# Patient Record
Sex: Female | Born: 1955 | Race: White | Hispanic: No | Marital: Married | Smoking: Former smoker
Health system: Southern US, Community
[De-identification: ages and names within clinical notes are randomized; demographics above are authoritative.]

## PROBLEM LIST (undated history)

## (undated) DIAGNOSIS — E785 Hyperlipidemia, unspecified: Secondary | ICD-10-CM

## (undated) DIAGNOSIS — G4700 Insomnia, unspecified: Secondary | ICD-10-CM

---

## 2007-01-29 ENCOUNTER — Ambulatory Visit: Payer: Self-pay | Admitting: Family Medicine

## 2007-06-27 HISTORY — PX: SEPTOPLASTY: SUR1290

## 2009-05-25 ENCOUNTER — Ambulatory Visit: Payer: Self-pay | Admitting: Family Medicine

## 2009-05-25 DIAGNOSIS — G4700 Insomnia, unspecified: Secondary | ICD-10-CM | POA: Insufficient documentation

## 2009-05-26 ENCOUNTER — Encounter: Payer: Self-pay | Admitting: Family Medicine

## 2009-05-27 DIAGNOSIS — E785 Hyperlipidemia, unspecified: Secondary | ICD-10-CM | POA: Insufficient documentation

## 2009-05-27 LAB — CONVERTED CEMR LAB
ALT: 10 units/L (ref 0–35)
AST: 14 units/L (ref 0–37)
Albumin: 4.5 g/dL (ref 3.5–5.2)
Alkaline Phosphatase: 66 units/L (ref 39–117)
BUN: 15 mg/dL (ref 6–23)
CO2: 29 meq/L (ref 19–32)
Calcium: 9.1 mg/dL (ref 8.4–10.5)
Chloride: 103 meq/L (ref 96–112)
Cholesterol: 237 mg/dL — ABNORMAL HIGH (ref 0–200)
Creatinine, Ser: 0.71 mg/dL (ref 0.40–1.20)
Glucose, Bld: 91 mg/dL (ref 70–99)
HDL: 69 mg/dL (ref >39)
LDL Cholesterol: 133 mg/dL — ABNORMAL HIGH (ref 0–99)
Potassium: 4.5 meq/L (ref 3.5–5.3)
Sodium: 139 meq/L (ref 135–145)
Total Bilirubin: 0.5 mg/dL (ref 0.3–1.2)
Total CHOL/HDL Ratio: 3.4
Total Protein: 7.2 g/dL (ref 6.0–8.3)
Triglycerides: 173 mg/dL — ABNORMAL HIGH (ref <150)
VLDL: 35 mg/dL (ref 0–40)

## 2009-05-30 ENCOUNTER — Telehealth: Payer: Self-pay | Admitting: Family Medicine

## 2009-05-30 ENCOUNTER — Encounter: Admission: RE | Admit: 2009-05-30 | Discharge: 2009-05-30 | Payer: Self-pay | Admitting: Family Medicine

## 2009-05-31 ENCOUNTER — Other Ambulatory Visit: Admission: RE | Admit: 2009-05-31 | Discharge: 2009-05-31 | Payer: Self-pay | Admitting: Family Medicine

## 2009-05-31 ENCOUNTER — Ambulatory Visit: Payer: Self-pay | Admitting: Family Medicine

## 2009-06-03 LAB — CONVERTED CEMR LAB
Pap Smear: NEGATIVE
Pap Smear: NORMAL

## 2010-02-14 ENCOUNTER — Ambulatory Visit: Payer: Self-pay | Admitting: Family Medicine

## 2010-02-14 DIAGNOSIS — B009 Herpesviral infection, unspecified: Secondary | ICD-10-CM | POA: Insufficient documentation

## 2010-02-14 LAB — CONVERTED CEMR LAB: Rapid Strep: NEGATIVE

## 2010-03-28 NOTE — Progress Notes (Signed)
Summary: Order for DEXA

## 2010-03-28 NOTE — Assessment & Plan Note (Signed)
Summary: NOV: Preventative Care   Vital Signs:  Patient profile:   55 year old female Height:      61.1 inches Weight:      118 pounds BMI:     22.30 Pulse rate:   63 / minute BP sitting:   123 / 71  (left arm) Cuff size:   regular  Vitals Entered By: Kathlene November (May 25, 2009 9:56 AM) CC: NP- get established Is Patient Diabetic? No   CC:  NP- get established.  Habits & Providers  Alcohol-Tobacco-Diet     Alcohol drinks/day: <1     Alcohol type: wine     Tobacco Status: quit     Year Quit: 2008  Exercise-Depression-Behavior     Does Patient Exercise: yes     Type of exercise: tennis, swimming     Exercise (avg: min/session): >60     Times/week: 3     Have you felt down or hopeless? no     STD Risk: never     Drug Use: never     Seat Belt Use: always  Current Medications (verified): 1)  None  Allergies (verified): No Known Drug Allergies  Comments:  Nurse/Medical Assistant: The patient's medications and allergies were reviewed with the patient and were updated in the Medication and Allergy Lists. Kathlene November (May 25, 2009 9:58 AM)  Past History:  Past Medical History: None  Past Surgical History: Appendectomy 03/24/09 Septoplasty 06/2007  Family History: Father deceased, skin Ca, HTN Mother with HTN.   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  Married to [place]. No kids.  Former Smoker Alcohol use-yes Regular exercise-yes 2 caffeinated drinks per day. Smoking Status:  quit Does Patient Exercise:  yes STD Risk:  never Drug Use:  never Seat Belt Use:  always  Review of Systems       No fever/sweats/weakness, unexplained weight loss/gain.  No vison changes.  No difficulty hearing/ringing in ears, hay fever/allergies.  No chest pain/discomfort, palpitations.  No Br lump/nipple discharge.  No cough/wheeze.  No blood in BM, nausea/vomiting/diarrhea.  No nighttime urination, leaking urine, unusual vaginal bleeding,  discharge (penis or vagina).  No muscle/joint pain. No rash, change in mole.  No HA, memory loss.  No anxiety, + sleep d/o, no depression.  No easy bruising/bleeding, unexplained lump   Physical Exam  General:  Well-developed,well-nourished,in no acute distress; alert,appropriate and cooperative throughout examination Head:  Normocephalic and atraumatic without obvious abnormalities. No apparent alopecia or balding. Lungs:  Normal respiratory effort, chest expands symmetrically. Lungs are clear to auscultation, no crackles or wheezes. Heart:  Normal rate and regular rhythm. S1 and S2 normal without gallop, murmur, click, rub or other extra sounds. Skin:  no rashes.   Psych:  Cognition and judgment appear intact. Alert and cooperative with normal attention span and concentration. No apparent delusions, illusions, hallucinations   Impression & Recommendations:  Problem # 1:  COUNSELING NOS (ICD-V65.40) Reviewed preventative care for her age group.  Given info to schedule her mammogram. Try to go for labs and then needs to schedule a CPE wiht pap as well.  Discussed colon Ca screening. She wants to think about this. Given Tdap today. Spent 25 min face to face in counseling.   Other Orders: T-Comprehensive Metabolic Panel [id number]) T-Lipid Profile [id number]) T-Mammography Bilateral Screening (41324) Tdap => 74yrs IM ([address]02) Admin 1st Vaccine (72536)  Patient Instructions: 1)  [facility]. Consider these for colon Cancer screening and give  me a call when you are ready.  2)  Please schedule a physical with pap smear. 3)  The lab is on the first floor, open Mon-Fri 8AM-5PM.  4)  Call to schedule your mammogram.   TD Result Date:  02/27/1999 TD Result:  given TD Next Due:  10 yr   Immunizations Administered:  Tetanus Vaccine:    Vaccine Type: Tdap    Site: left deltoid    Mfr: GlaxoSmithKline    Given by: Kathlene November    Exp. Date: 05/21/2011     Lot #: [id number]    VIS given: 01/14/07 version given May 25, 2009.

## 2010-03-28 NOTE — Assessment & Plan Note (Signed)
Summary: CPE & PAP   Vital Signs:  Patient profile:   55 year old female Menstrual status:  irregular LMP:     2-3 Height:      61.1 inches Weight:      120 pounds Pulse rate:   77 / minute BP sitting:   119 / 65  (left arm) Cuff size:   regular  Vitals Entered By: Kathlene November (May 31, 2009 10:03 AM) CC: CPE with pap LMP (date): 2-3     Menstrual Status irregular Enter LMP: 2-3   Primary Care Provider:  Nani Gasser   CC:  CPE with pap.  History of Present Illness: Insomoni: Usually has problems staying asleep.  Before would wake up with hot flashes but that has gotten better.  happens a couple fo nights a week. Now feels like she is in  a cycle. - Has tried Tylenol PM but makes her knees "feel weird".  Has tried [place] in teh past and that has helpd. Also has tried melanonin. Somonex made her feel groggy.   Problems Prior to Update: 1)  Hyperlipidemia  (ICD-272.4) 2)  Counseling Nos  (ICD-V65.40) 3)  Insomnia  (ICD-780.52) 4)  Health Maintenance Exam  (ICD-V70.0)  Current Medications (verified): 1)  Fish Oil 1000 Mg Caps (Omega-3 Fatty Acids) .... 2 Tabs By Mouth Daily  Allergies (verified): No Known Drug Allergies  Comments:  Nurse/Medical Assistant: The patient's medications and allergies were reviewed with the patient and were updated in the Medication and Allergy Lists. 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  Married to [place]. No kids.  Former Smoker Alcohol use-yes Regular exercise-yes 2 caffeinated drinks per day.   Review of Systems  The patient denies anorexia, fever, weight loss, weight gain, vision loss, decreased hearing, hoarseness, chest pain, syncope, dyspnea on exertion,  peripheral edema, prolonged cough, headaches, hemoptysis, abdominal pain, melena, hematochezia, severe indigestion/heartburn, hematuria, incontinence, genital sores, muscle weakness, suspicious skin lesions, transient blindness, difficulty walking, depression, unusual weight change, abnormal bleeding, enlarged lymph nodes, and breast masses.    Physical Exam  General:  Well-developed,well-nourished,in no acute distress; alert,appropriate and cooperative throughout examination Head:  Normocephalic and atraumatic without obvious abnormalities. No apparent alopecia or balding. Eyes:  No corneal or conjunctival inflammation noted. EOMI. Perrla.  Ears:  External ear exam shows no significant lesions or deformities.  Otoscopic examination reveals clear canals, tympanic membranes are intact bilaterally without bulging, retraction, inflammation or discharge. Hearing is grossly normal bilaterally. Nose:  External nasal examination shows no deformity or inflammation.  Mouth:  Oral mucosa and oropharynx without lesions or exudates.  Teeth in good repair. Neck:  No deformities, masses, or tenderness noted. Chest Wall:  No deformities, masses, or tenderness noted. Breasts:  No mass, nodules, thickening, tenderness, bulging, retraction, inflamation, nipple discharge or skin changes noted.   Lungs:  Normal respiratory effort, chest expands symmetrically. Lungs are clear to auscultation, no crackles or wheezes. Heart:  Normal rate and regular rhythm. S1 and S2 normal without gallop, murmur, click, rub or other extra sounds. Abdomen:  Bowel sounds positive,abdomen soft and non-tender without masses, organomegaly or hernias noted. Rectal:  No external abnormalities noted. Normal sphincter tone. No rectal masses or tenderness. Genitalia:  normal introitus and no external lesions.  Cervix is stenotic.  Msk:  No deformity or scoliosis noted of thoracic or lumbar spine.   Pulses:  R and L carotid,radial,dorsalis pedis  and posterior tibial pulses are full and equal bilaterally Extremities:  No clubbing, cyanosis, edema, or deformity noted with normal full range of motion of all joints.   Neurologic:  No cranial nerve deficits noted. Station and gait are normal.DTRs are symmetrical throughout. Sensory, motor and coordinative functions appear intact. Skin:  no rashes.   Cervical Nodes:  No lymphadenopathy noted Axillary Nodes:  No palpable lymphadenopathy Psych:  Cognition and judgment appear intact. Alert and cooperative with normal attention span and concentration. No apparent delusions, illusions, hallucinations   Impression & Recommendations:  Problem # 1:  HEALTH MAINTENANCE EXAM (ICD-V70.0) Exam is normal today.  She had her DEXA and mammogram yesterday.  Reviewed her labs wtih her today.  Discussed colon cancer screening.  She will schedule a colonoscopy this summer. She will call us.   Problem # 2:  INSOMNIA (ICD-780.52)  Discussed sleep hygiene. Also discussed different types of sleep aids and potential SE. Will start with generic ambien for as needed use only. Given 30 tab which shouldreally last 2 months. If finding using more frequently then recommend come in to further evluation. Discussed trying to reset her "sleep cycle".    Her updated medication list for this problem includes:    Zolpidem Tartrate 10 Mg Tabs (Zolpidem tartrate) .Marland Kitchen... Take 1 tablet by mouth once a day at bedtime as needed .  Complete Medication List: 1)  Fish Oil 1000 Mg Caps (Omega-3 fatty acids) .... 2 tabs by mouth daily 2)  Zolpidem Tartrate 10 Mg Tabs (Zolpidem tartrate) .... Take 1 tablet by mouth once a day at bedtime as needed . Prescriptions: ZOLPIDEM TARTRATE 10 MG TABS (ZOLPIDEM TARTRATE) Take 1 tablet by mouth once a day at bedtime as needed .  #30 x 0   Entered and Authorized by:   Nani Gasser MD   Signed by:   Nani Gasser MD on 05/31/2009   Method used:   Print then Give to Patient   RxID:    [id number]   Last TD:  Tdap (05/25/2009 9:46:50 AM) TD Next Due:  10 yr

## 2010-03-30 NOTE — Assessment & Plan Note (Signed)
Summary: SORE THROAT/EAR ACHE   Vital Signs:  Patient profile:   55 year old female Menstrual status:  irregular Height:      61.1 inches Weight:      108 pounds Temp:     98.2 degrees F oral Pulse rate:   81 / minute BP sitting:   136 / 84  (right arm) Cuff size:   regular  Vitals Entered By: Avon Gully CMA, Duncan Dull) (February 14, 2010 1:32 PM) CC: sore throat since yesterday,ear pain   Primary Care Provider:  Nani Gasser   CC:  sore throat since yesterday and ear pain.  History of Present Illness: sore throat started 2 days ago,now having ear pain.  Has a larger fever blister as well.  Has been a little stressed.  No strep exposure.  No fever. No nausea. No HA.  Pain has been moderate. Uncomfrotable to swallow.   Current Medications (verified): 1)  Fish Oil 1000 Mg Caps (Omega-3 Fatty Acids) .... 2 Tabs By Mouth Daily 2)  Zolpidem Tartrate 10 Mg Tabs (Zolpidem Tartrate) .... Take 1 Tablet By Mouth Once A Day At Bedtime As Needed . 3)  Calcium-Vitamin D 600-200 Mg-Unit Tabs (Calcium-Vitamin D) .... Take 1 Tablet By Mouth Two Times A Day 4)  Alendronate Sodium 70 Mg Tabs (Alendronate Sodium) .... One By Mouth Once A Week.  Allergies (verified): No Known Drug Allergies  Comments:  Nurse/Medical Assistant: The patient's medications and allergies were reviewed with the patient and were updated in the Medication and Allergy Lists. Avon Gully CMA, Duncan Dull) (February 14, 2010 1:33 PM)  Physical Exam  General:  Well-developed,well-nourished,in no acute distress; alert,appropriate and cooperative throughout examination Head:  Normocephalic and atraumatic without obvious abnormalities. No apparent alopecia or balding. Eyes:  No corneal or conjunctival inflammation noted. EOMI. Perrla.  Ears:  External ear exam shows no significant lesions or deformities.  Otoscopic examination reveals clear canals, tympanic membranes are intact bilaterally without bulging,  retraction, inflammation or discharge. Hearing is grossly normal bilaterally. Nose:  External nasal examination shows no deformity or inflammation. Nasal mucosa are pink and moist without lesions or exudates. Mouth:  Oral mucosa and oropharynx without lesions or exudates.  Teeth in good repair. Petechia on the uvula.  Neck:  No deformities, masses, or tenderness noted. Lungs:  Normal respiratory effort, chest expands symmetrically. Lungs are clear to auscultation, no crackles or wheezes. Heart:  Normal rate and regular rhythm. S1 and S2 normal without gallop, murmur, click, rub or other extra sounds. Skin:  no rashes.   Cervical Nodes:  Mild anter cerv LN.  Psych:  Cognition and judgment appear intact. Alert and cooperative with normal attention span and concentration. No apparent delusions, illusions, hallucinations   Impression & Recommendations:  Problem # 1:  PHARYNGITIS-ACUTE (ICD-462)  Strep is neg. LIkely viral. Exam is failry benign except for LN.  Call if not better in one week. No ABX needed. Reiterated this to the patient.   Orders: Rapid Strep (16109)  Problem # 2:  COLD SORE (ICD-054.9) Discussed options. Will tx with acyclovir. Discussed have to terat within 24 hours of onset.   Complete Medication List: 1)  Fish Oil 1000 Mg Caps (Omega-3 fatty acids) .... 2 tabs by mouth daily 2)  Zolpidem Tartrate 10 Mg Tabs (Zolpidem tartrate) .... Take 1 tablet by mouth once a day at bedtime as needed . 3)  Calcium-vitamin D 600-200 Mg-unit Tabs (Calcium-vitamin d) .... Take 1 tablet by mouth two times a day 4)  Alendronate Sodium 70 Mg Tabs (Alendronate sodium) .... One by mouth once a week. 5)  Acyclovir 400 Mg Tabs (Acyclovir) .... Take 1 tablet by mouth three times a day for 5 days.  Patient Instructions: 1)  Call if still having Sore Throat in one week.  2)  Call if getting worse Prescriptions: ACYCLOVIR 400 MG TABS (ACYCLOVIR) Take 1 tablet by mouth three times a day for 5  days.  #15 x 0   Entered and Authorized by:   Nani Gasser MD   Signed by:   Nani Gasser MD on 02/14/2010   Method used:   [facility]        [facility] [phone][facility] (retail)       [address] [place], [place]  [address]       Ph: [phone] or [phone]       Fax: [phone]   RxID:   [id number]    Orders Added: 1)  Rapid Strep [32355] 2)  Est. Patient Level III [73220]    Laboratory Results  Date/Time Received: 02/14/10 Date/Time Reported: 02/14/10  Other Tests  Rapid Strep: negative

## 2010-05-24 ENCOUNTER — Encounter: Payer: Self-pay | Admitting: Emergency Medicine

## 2010-05-24 ENCOUNTER — Inpatient Hospital Stay (INDEPENDENT_AMBULATORY_CARE_PROVIDER_SITE_OTHER)
Admission: RE | Admit: 2010-05-24 | Discharge: 2010-05-24 | Disposition: A | Discharge status: Home / Self Care | Payer: BC Managed Care – PPO | Source: Ambulatory Visit | Attending: Emergency Medicine | Admitting: Emergency Medicine

## 2010-05-24 DIAGNOSIS — J0100 Acute maxillary sinusitis, unspecified: Secondary | ICD-10-CM

## 2010-05-27 ENCOUNTER — Other Ambulatory Visit: Payer: Self-pay | Admitting: Family Medicine

## 2010-05-30 NOTE — Assessment & Plan Note (Signed)
Summary: RASH ON FACE/TJ rm 4   Vital Signs:  Patient Profile:   55 Years Old Female CC:      sinus congestion and cough Height:     61.1 inches Weight:      107.50 pounds O2 Sat:      96 % O2 treatment:    Room Air Temp:     99.8 degrees F oral Pulse rate:   102 / minute Resp:     18 per minute BP sitting:   126 / 80  (left arm) Cuff size:   regular  Vitals Entered By: Clemens Catholic LPN (May 24, 2010 5:49 PM)                  Updated Prior Medication List: FISH OIL 1000 MG CAPS (OMEGA-3 FATTY ACIDS) 2 tabs by mouth daily ZOLPIDEM TARTRATE 10 MG TABS (ZOLPIDEM TARTRATE) Take 1 tablet by mouth once a day at bedtime as needed . CALCIUM-VITAMIN D 600-200 MG-UNIT TABS (CALCIUM-VITAMIN D) Take 1 tablet by mouth two times a day  Current Allergies: No known allergies History of Present Illness History from: patient Chief Complaint: sinus congestion and cough History of Present Illness: 55 Years Old Female complains of onset of cold symptoms for 4-5 days.  Brennah has been using OTC cold meds which is helping a little bit. ? sore throat +cough No pleuritic pain No wheezing +nasal congestion +post-nasal drainage +sinus pain/pressure No chest congestion No itchy/red eyes No earache No hemoptysis No SOB +chills/sweats +fever No nausea No vomiting No abdominal pain No diarrhea No skin rashes No fatigue No myalgias No headache   REVIEW OF SYSTEMS Constitutional Symptoms       Complains of fever, chills, and night sweats.     Denies weight loss, weight gain, and fatigue.  Eyes       Denies change in vision, eye pain, eye discharge, glasses, contact lenses, and eye surgery. Ear/Nose/Throat/Mouth       Complains of ear pain and sinus problems.      Denies hearing loss/aids, change in hearing, ear discharge, dizziness, frequent runny nose, frequent nose bleeds, sore throat, hoarseness, and tooth pain or bleeding.  Respiratory       Complains of dry cough and  wheezing.      Denies productive cough, shortness of breath, asthma, bronchitis, and emphysema/COPD.  Cardiovascular       Complains of tires easily with exhertion.      Denies murmurs and chest pain.    Gastrointestinal       Denies stomach pain, nausea/vomiting, diarrhea, constipation, blood in bowel movements, and indigestion. Genitourniary       Denies painful urination, kidney stones, and loss of urinary control. Neurological       Denies paralysis, seizures, and fainting/blackouts. Musculoskeletal       Complains of muscle pain and joint pain.      Denies joint stiffness, decreased range of motion, redness, swelling, muscle weakness, and gout.  Skin       Denies bruising, unusual mles/lumps or sores, and hair/skin or nail changes.  Psych       Denies mood changes, temper/anger issues, anxiety/stress, speech problems, depression, and sleep problems. Other Comments: pt c/o dry cough, LT side sinus congestion, fever x 4 days. she has used cough gtts, saline NS and Claritin with no relief.   Past History:  Past Medical History: Reviewed history from 05/25/2009 and no changes required. None  Past Surgical History: Reviewed history from 05/25/2009  and no changes required. Appendectomy 03/24/09 Septoplasty 06/2007  Family History: Reviewed history from 05/25/2009 and no changes required. Father deceased, skin Ca, HTN Mother with HTN.   Social History: Reviewed history from 05/25/2009 and no changes required. 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  Married to [place]. No kids.  Former Smoker Alcohol use-yes Regular exercise-yes 2 caffeinated drinks per day.  Physical Exam General appearance: well developed, well nourished, no acute distress Head: maxillary sinus tenderness Nasal: clear discharge Oral/Pharynx: clear PND, no erythema, no exudate Chest/Lungs: no rales, wheezes, or rhonchi bilateral, breath sounds equal without effort Heart: regular rate and   rhythm, no murmur MSE: oriented to time, place, and person Assessment New Problems: SINUSITIS, MAXILLARY, ACUTE (ICD-461.0)   Plan New Medications/Changes: ZITHROMAX Z-PAK 250 MG TABS (AZITHROMYCIN) use as directed  #1 x 0, 05/24/2010, Hoyt Koch MD  New Orders: New Patient Level III [phone] Services provided After hours-Weekends-Holidays [99051] Pulse Oximetry (single measurment) [94760] Planning Comments:   1)  Take the prescribed antibiotic as instructed. 2)  Use nasal saline solution (over the counter) at least 3 times a day. 3)  Use over the counter decongestants like Zyrtec-D every 12 hours as needed to help with congestion. 4)  Can take tylenol every 6 hours or motrin every 8 hours for pain or fever. 5)  Follow up with your primary doctor  if no improvement in 5-7 days, sooner if increasing pain, fever, or new symptoms.    The patient and/or caregiver has been counseled thoroughly with regard to medications prescribed including dosage, schedule, interactions, rationale for use, and possible side effects and they verbalize understanding.  Diagnoses and expected course of recovery discussed and will return if not improved as expected or if the condition worsens. Patient and/or caregiver verbalized understanding.  Prescriptions: ZITHROMAX Z-PAK 250 MG TABS (AZITHROMYCIN) use as directed  #1 x 0   Entered and Authorized by:   Hoyt Koch MD   Signed by:   Hoyt Koch MD on 05/24/2010   Method used:   Telephoned to ...       [facility]  [place] [phone]       [address] [place], [place]  [address]       Ph: [phone] or [phone]       Fax: [phone]   RxID:   [id number]   Orders Added: 1)  New Patient Level III [99203] 2)  Services provided After hours-Weekends-Holidays [99051] 3)  Pulse Oximetry (single measurment) [02542]

## 2010-05-31 ENCOUNTER — Other Ambulatory Visit: Payer: Self-pay | Admitting: *Deleted

## 2010-05-31 MED ORDER — ACYCLOVIR 400 MG PO TABS: 400.0000 mg | ORAL_TABLET | Freq: Three times a day (TID) | ORAL | Status: DC

## 2010-05-31 MED ORDER — ZOLPIDEM TARTRATE 10 MG PO TABS: 10.0000 mg | ORAL_TABLET | Freq: Every evening | ORAL | Status: DC | PRN

## 2010-05-31 NOTE — Telephone Encounter (Signed)
Message left advising pt that rx's have been called to pharm.

## 2010-05-31 NOTE — Telephone Encounter (Signed)
Pt requests refill on 2 medications. She states she called her pharmacy on Saturday and they have not heard from [place].  Pt states she has a fever blister developing and would like acyclovir before it is full blown.  She would also like a refill so she can take as soon as she feels the tingle.  Rx order entered.  Please advise.

## 2010-07-13 ENCOUNTER — Other Ambulatory Visit: Payer: Self-pay | Admitting: Family Medicine

## 2010-07-13 DIAGNOSIS — Z1231 Encounter for screening mammogram for malignant neoplasm of breast: Secondary | ICD-10-CM

## 2010-07-18 ENCOUNTER — Ambulatory Visit
Admission: RE | Admit: 2010-07-18 | Discharge: 2010-07-18 | Disposition: A | Discharge status: Home / Self Care | Payer: BC Managed Care – PPO | Source: Ambulatory Visit | Attending: Family Medicine | Admitting: Family Medicine

## 2010-07-18 ENCOUNTER — Telehealth: Payer: Self-pay | Admitting: Family Medicine

## 2010-07-18 DIAGNOSIS — Z1231 Encounter for screening mammogram for malignant neoplasm of breast: Secondary | ICD-10-CM

## 2010-07-18 NOTE — Telephone Encounter (Signed)
Pt called and said she had screening mammo today at [facility], and they felt a lump on her breast, but the person who took message did not determine or notate which breast.  Recommended to do diagnostic mammogram.  I don't see the mammo report yet.  Please advise. Plan:  Routed to Dr. Marlyne Beards, LPN Domingo Dimes

## 2010-07-19 ENCOUNTER — Other Ambulatory Visit: Payer: Self-pay | Admitting: Family Medicine

## 2010-07-19 ENCOUNTER — Telehealth: Payer: Self-pay | Admitting: Family Medicine

## 2010-07-19 DIAGNOSIS — R928 Other abnormal and inconclusive findings on diagnostic imaging of breast: Secondary | ICD-10-CM

## 2010-07-19 DIAGNOSIS — N63 Unspecified lump in unspecified breast: Secondary | ICD-10-CM

## 2010-07-19 NOTE — Telephone Encounter (Signed)
Will you call again to have them fax the report. I need to know which breast they need the diag on  Or if they want if for both breasts.

## 2010-07-19 NOTE — Telephone Encounter (Signed)
Order has been released-

## 2010-07-19 NOTE — Telephone Encounter (Signed)
Dr. Linford Arnold, I called the [facility] on this patient and they cancelled the prev test ordered and the correct test for diagnostic mammo has been put in as order, but the imaging ctr just needs you to do the referral to say diag mammo.  Thanks. Jarvis Newcomer, LPN Dom[facility] Dimes

## 2010-07-20 ENCOUNTER — Telehealth: Payer: Self-pay | Admitting: Family Medicine

## 2010-07-20 NOTE — Telephone Encounter (Addendum)
Pt called and needs scheduled OV for a lump that is felt in her LT breast from the [facility] staff downstairs when she had just gone in for a routine mammo because the imaging center had sent her a letter saying time to schedule screen mammo.  When the tech was asking questions the patient told the tech that she had felt a pea size lump in LT breast, and at that point the imaging staff told the pt she needed to cancel for the day and she would need diag mammo that was not done downstairs, tore up the order, and told her to come upstairs and schedule with Dr. Linford Arnold.  Pt calls in today to check on status and is confused by the process because she had came in to the office earlier in the week and   spoke with someone to figure out what needed to be done. Plan:  Pt obviously needs office visit for Dr. Linford Arnold to check this lump in LT breast which she has never assessed.  Pt prefers to go local for diag mammo, but Dr. Linford Arnold says she'll have to go to [facility] or [facility] but the problem to sending to [facility] is that is takes longer to get the report since they are not in our system.  She will discuss these issues with the pt at the office visit tomorrow.  Pt scheduled appt for Friday 07-21-10 @ 09:45am. Jarvis Newcomer, LPN [facility] Dimes

## 2010-07-21 ENCOUNTER — Encounter: Payer: Self-pay | Admitting: Family Medicine

## 2010-07-21 ENCOUNTER — Ambulatory Visit (INDEPENDENT_AMBULATORY_CARE_PROVIDER_SITE_OTHER): Payer: BC Managed Care – PPO | Admitting: Family Medicine

## 2010-07-21 VITALS — BP 119/73 | HR 69 | Ht 61.1 in | Wt 109.0 lb

## 2010-07-21 DIAGNOSIS — N63 Unspecified lump in unspecified breast: Secondary | ICD-10-CM

## 2010-07-21 MED ORDER — ALENDRONATE SODIUM 70 MG PO TABS: 70.0000 mg | ORAL_TABLET | ORAL | Status: DC

## 2010-07-21 NOTE — Progress Notes (Signed)
  Subjective:    Patient ID: Jordan Maynard, female    DOB: June 19, 1955, 55 y.o.   MRN: [id number]  HPI Left breat in the lower innner quadrant with a small lump for approximately one month.. No pain or tenderness.  No family hx of brca. Last mammo was last year. No redness or recent infection. She's never had any problems with her breast health before.   Review of Systems     Objective:   Physical Exam    right breast exam is completely normal with no change in skin or palpable nodules. The left breast does have a small 1 x 0.5 cm firm nodule in the inner lower quadrant. Along the edge of the breast tissue. It is nontender and there is no surrounding rash or erythema. No axillary lymphadenopathy.    Assessment & Plan:  Breast lesion-will schedule for diagnostic mammogram. Patient prefers [place]. I did give reassurance is most likely a cyst or benign cystic tissue.

## 2010-07-25 NOTE — Telephone Encounter (Signed)
Dr. Linford Arnold took care of this order.

## 2010-07-26 ENCOUNTER — Telehealth: Payer: Self-pay | Admitting: Family Medicine

## 2010-07-26 NOTE — Telephone Encounter (Signed)
Pt called and was inquiring as to when she was going for diagnostic mammo.  Pt was frustrated that she had not heard anything.  Was in last Friday and Dr. Linford Arnold said she was going to schedule. Plan:  Talked with Dr. Linford Arnold.  Referral was done under imaging.  Had Victorino Dike in referrals pull the referral and get sched today.  Appt 08-01-10 @ 10:45am.  Pt to arrive @ 10:30am.  Pt informed.  Jarvis Newcomer, LPN [facility]

## 2010-07-28 ENCOUNTER — Other Ambulatory Visit: Payer: Self-pay | Admitting: Family Medicine

## 2010-07-28 DIAGNOSIS — G479 Sleep disorder, unspecified: Secondary | ICD-10-CM

## 2010-07-28 MED ORDER — ZOLPIDEM TARTRATE 10 MG PO TABS: 10.0000 mg | ORAL_TABLET | Freq: Every evening | ORAL | Status: DC | PRN

## 2010-07-28 NOTE — Telephone Encounter (Signed)
Pt called and is requesting a refill of her generic ambien 10mg .  Said pharm faxed our office on 07/24/10, and 07/26/10 without a response from our office.   Plan:  Not in system electronically.  Will check the faxes at nurses station.  Retrieved 2 faxes that [facility] had sent.  WIll review for refill and send fax back to the pharmacy.  Zolpidem 10 mg PO #30/0 refills were sent to the pharmacy via fax @ 10:55am.  Pt. Informed. Jarvis Newcomer, LPN [facility]

## 2010-08-15 ENCOUNTER — Encounter: Payer: Self-pay | Admitting: Family Medicine

## 2010-08-24 ENCOUNTER — Encounter: Payer: Self-pay | Admitting: Family Medicine

## 2010-09-11 ENCOUNTER — Encounter: Payer: Self-pay | Admitting: Emergency Medicine

## 2010-09-11 ENCOUNTER — Inpatient Hospital Stay (INDEPENDENT_AMBULATORY_CARE_PROVIDER_SITE_OTHER)
Admission: RE | Admit: 2010-09-11 | Discharge: 2010-09-11 | Disposition: A | Discharge status: Home / Self Care | Payer: BC Managed Care – PPO | Source: Ambulatory Visit | Attending: Emergency Medicine | Admitting: Emergency Medicine

## 2010-09-11 DIAGNOSIS — L2089 Other atopic dermatitis: Secondary | ICD-10-CM | POA: Insufficient documentation

## 2010-09-12 ENCOUNTER — Other Ambulatory Visit: Payer: Self-pay | Admitting: Family Medicine

## 2010-09-12 NOTE — Telephone Encounter (Signed)
[facility] called and needs to verify the directions on the alenodrate 70 mg once weekly script. Plan:  Notified [facility] and verified dosing directions. Jarvis Newcomer, LPN [facility]

## 2010-09-15 ENCOUNTER — Encounter: Payer: Self-pay | Admitting: Family Medicine

## 2010-09-15 ENCOUNTER — Inpatient Hospital Stay (INDEPENDENT_AMBULATORY_CARE_PROVIDER_SITE_OTHER)
Admission: RE | Admit: 2010-09-15 | Discharge: 2010-09-15 | Disposition: A | Discharge status: Home / Self Care | Payer: BC Managed Care – PPO | Source: Ambulatory Visit | Attending: Family Medicine | Admitting: Family Medicine

## 2010-09-15 DIAGNOSIS — L740 Miliaria rubra: Secondary | ICD-10-CM

## 2010-09-25 ENCOUNTER — Other Ambulatory Visit: Payer: Self-pay | Admitting: *Deleted

## 2010-09-25 MED ORDER — ZOLPIDEM TARTRATE 10 MG PO TABS: 10.0000 mg | ORAL_TABLET | Freq: Every evening | ORAL | Status: DC | PRN

## 2010-11-20 ENCOUNTER — Other Ambulatory Visit: Payer: Self-pay | Admitting: *Deleted

## 2010-11-20 DIAGNOSIS — G479 Sleep disorder, unspecified: Secondary | ICD-10-CM

## 2010-11-20 MED ORDER — ZOLPIDEM TARTRATE 10 MG PO TABS: 10.0000 mg | ORAL_TABLET | Freq: Every evening | ORAL | Status: DC | PRN

## 2011-01-29 NOTE — Progress Notes (Signed)
Summary: RASH (room 4)   Vital Signs:  Patient Profile:   55 Years Old Female CC:      Scattered rash x 24 hours ago Height:     61.1 inches Weight:      110 pounds O2 Sat:      96 % O2 treatment:    Room Air Temp:     97.8 degrees F oral Pulse rate:   80 / minute Resp:     16 per minute BP sitting:   98 / 64  (left arm) Cuff size:   regular  Vitals Entered By: Lavell Islam, RN                  Updated Prior Medication List: FISH OIL 1000 MG CAPS (OMEGA-3 FATTY ACIDS) 2 tabs by mouth daily ZOLPIDEM TARTRATE 10 MG TABS (ZOLPIDEM TARTRATE) Take 1 tablet by mouth once a day at bedtime as needed . CALCIUM-VITAMIN D 600-200 MG-UNIT TABS (CALCIUM-VITAMIN D) Take 1 tablet by mouth two times a day FOSAMAX 70 MG TABS (ALENDRONATE SODIUM) weekly  Current Allergies: No known allergies History of Present Illness History from: patient Chief Complaint: Scattered rash x 24 hours ago History of Present Illness: Scattered rash since yesterday.  Was outside playing tennis yesterday but doesn't recall getting in to any poison ivy, no soaps, shampoos, detergents, pets, travel.  No new meds, has been on Fosamax for 2 months now with no problems.  Rash is a little itchy and feels tingly. Not using any meds.  REVIEW OF SYSTEMS Constitutional Symptoms      Denies fever, chills, night sweats, weight loss, weight gain, and fatigue.  Eyes       Denies change in vision, eye pain, eye discharge, glasses, contact lenses, and eye surgery. Ear/Nose/Throat/Mouth       Denies hearing loss/aids, change in hearing, ear pain, ear discharge, dizziness, frequent runny nose, frequent nose bleeds, sinus problems, sore throat, hoarseness, and tooth pain or bleeding.  Respiratory       Denies dry cough, productive cough, wheezing, shortness of breath, asthma, bronchitis, and emphysema/COPD.  Cardiovascular       Denies murmurs, chest pain, and tires easily with exhertion.    Gastrointestinal       Denies  stomach pain, nausea/vomiting, diarrhea, constipation, blood in bowel movements, and indigestion. Genitourniary       Denies painful urination, kidney stones, and loss of urinary control. Neurological       Denies paralysis, seizures, and fainting/blackouts. Musculoskeletal       Denies muscle pain, joint pain, joint stiffness, decreased range of motion, redness, swelling, muscle weakness, and gout.  Skin       Denies bruising, unusual mles/lumps or sores, and hair/skin or nail changes.  Psych       Denies mood changes, temper/anger issues, anxiety/stress, speech problems, depression, and sleep problems. Other Comments: Scattered rash extremeties, upper chest and abd. x 24 hours    Past History:  Family History: Last updated: 2009/06/22 Father deceased, skin Ca, HTN Mother with HTN.   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  Married to Jordan Maynard. No kids.  Former Smoker Alcohol use-yes Regular exercise-yes 2 caffeinated drinks per day.   Past Medical History: None Unremarkable  Past Surgical History: Reviewed history from 06-22-2009 and no changes required. Appendectomy 03/24/09 Septoplasty 06/2007  Family History: Reviewed history from 06-22-2009 and no changes required. Father deceased, skin Ca, HTN Mother with HTN.  Social History: Reviewed history from 05/25/2009 and no changes required. 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  Married to Jordan Maynard. No kids.  Former Smoker Alcohol use-yes Regular exercise-yes 2 caffeinated drinks per day.  Physical Exam General appearance: well developed, well nourished, no acute distress Oral/Pharynx: OP patent MSE: oriented to time, place, and person Scattered erythematous blanching lacy rash over most of body including both legs, arms, torso, neck.   Assessment New Problems: DERMATITIS, ATOPIC (ICD-691.8)   Plan New  Medications/Changes: PREDNISONE (PAK) 10 MG TABS (PREDNISONE) use as directed  #1 x 0, 09/11/2010, Jordan Koch MD  New Orders: Solumedrol up to 125mg  [J2930] Est. Patient Level IV [40981] Planning Comments:   Treat with Solumedrol IM now and pred taper starting tomorrow.  Likely due to solar radiation vs other atopic cause.  Cool shower/compresses.  Should be gradually getting better.  Can also consider Aloe or other soothing creams.  Rest, hydration.  Follow-up with your primary care physician if not improving or if getting worse   The patient and/or caregiver has been counseled thoroughly with regard to medications prescribed including dosage, schedule, interactions, rationale for use, and possible side effects and they verbalize understanding.  Diagnoses and expected course of recovery discussed and will return if not improved as expected or if the condition worsens. Patient and/or caregiver verbalized understanding.  Prescriptions: PREDNISONE (PAK) 10 MG TABS (PREDNISONE) use as directed  #1 x 0   Entered and Authorized by:   Jordan Koch MD   Signed by:   Jordan Koch MD on 09/11/2010   Method used:   Print then Give to Patient   RxID:   [id number]   Medication Administration  Injection # 1:    Medication: Solumedrol up to 125mg     Diagnosis: DERMATITIS, ATOPIC (ICD-691.8)    Route: IM    Site: LUOQ gluteus    Exp Date: 02/2013    Lot #: [id number]    Mfr: PFIZER    Patient tolerated injection without complications    Given by: Lavell Islam RN (September 11, 2010 5:36 PM)  Orders Added: 1)  Solumedrol up to 125mg  [J2930] 2)  Est. Patient Level IV [57846]

## 2011-01-29 NOTE — Progress Notes (Signed)
Summary: rash (rm 5)   Vital Signs:  Patient Profile:   55 Years Old Female CC:      rash progressing Height:     61.1 inches Weight:      111 pounds O2 Sat:      100 % O2 treatment:    Room Air Temp:     98.6 degrees F oral Pulse rate:   66 / minute Resp:     12 per minute BP sitting:   101 / 71  (left arm) Cuff size:   regular  Vitals Entered By: Lajean Saver RN (September 15, 2010 9:06 AM)                  Updated Prior Medication List: FISH OIL 1000 MG CAPS (OMEGA-3 FATTY ACIDS) 2 tabs by mouth daily ZOLPIDEM TARTRATE 10 MG TABS (ZOLPIDEM TARTRATE) Take 1 tablet by mouth once a day at bedtime as needed . CALCIUM-VITAMIN D 600-200 MG-UNIT TABS (CALCIUM-VITAMIN D) Take 1 tablet by mouth two times a day FOSAMAX 70 MG TABS (ALENDRONATE SODIUM) weekly PREDNISONE (PAK) 10 MG TABS (PREDNISONE) use as directed  Current Allergies: No known allergies History of Present Illness Chief Complaint: rash progressing History of Present Illness:  Subjective:  Patient reports that rash improved somewhat after Solumedrol and prednisone.  However, she has been playing tennis outside in the sun and rash is recurring, primarily on arms, legs.  The rash is somewhat tingly and irritating.   She has been applying sunscreen when outside.  No mouth lesions.  Feels well otherwise.  REVIEW OF SYSTEMS Constitutional Symptoms      Denies fever, chills, night sweats, weight loss, weight gain, and fatigue.  Eyes       Denies change in vision, eye pain, eye discharge, glasses, contact lenses, and eye surgery. Ear/Nose/Throat/Mouth       Denies hearing loss/aids, change in hearing, ear pain, ear discharge, dizziness, frequent runny nose, frequent nose bleeds, sinus problems, sore throat, hoarseness, and tooth pain or bleeding.  Respiratory       Denies dry cough, productive cough, wheezing, shortness of breath, asthma, bronchitis, and emphysema/COPD.  Cardiovascular       Denies murmurs, chest pain,  and tires easily with exhertion.    Gastrointestinal       Denies stomach pain, nausea/vomiting, diarrhea, constipation, blood in bowel movements, and indigestion. Genitourniary       Denies painful urination, kidney stones, and loss of urinary control. Neurological       Denies paralysis, seizures, and fainting/blackouts. Musculoskeletal       Complains of redness and swelling.      Denies muscle pain, joint pain, joint stiffness, decreased range of motion, muscle weakness, and gout.  Skin       Denies bruising, unusual mles/lumps or sores, and hair/skin or nail changes.  Psych       Denies mood changes, temper/anger issues, anxiety/stress, speech problems, depression, and sleep problems. Other Comments: Patient seen for rash 4 days ago. Given solumedrol injection. Still taking prednisone. "rash is moving around" hands swelling today   Past History:  Past Medical History: Reviewed history from 09/11/2010 and no changes required. None Unremarkable  Past Surgical History: Reviewed history from 05/25/2009 and no changes required. Appendectomy 03/24/09 Septoplasty 06/2007  Family History: Reviewed history from 05/25/2009 and no changes required. Father deceased, skin Ca, HTN Mother with HTN.   Social History: Reviewed history from 05/25/2009 and no changes required. 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  Married to [place]. No kids.  Former Smoker Alcohol use-yes Regular exercise-yes 2 caffeinated drinks per day.    Objective:  Appearance:  Patient appears healthy, stated age, and in no acute distress  Eyes:  Pupils are equal, round, and reactive to light and accomodation.  Extraocular movement is intact.  Conjunctivae are not inflamed.  Mouth/pharynx:  No lesions Neck:  Supple.  No adenopathy is present.   Lungs:  Clear to auscultation.  Breath sounds are equal.  Heart:  Regular rate and rhythm without murmurs, rubs, or gallops.  Skin:  Macular,  faintly erythematous, lacy eruption (not especially follicular) on extremities and trunk. Extremities:  No edema.   Assessment New Problems: PRICKLY HEAT (ICD-705.1)  "HEAT RASH" ("PRICKLY HEAT")  Plan New Orders: Est. Patient Level III [99213] Planning Comments:   Avoind applyng creams or oils.  Consider using calamine lotion.  Benadryl as needed.  Suggest wearing long sleeve cotton shirts. [facility] info sheet given describing other treatment options for heat rash. If not improving, recommend follow-up by dermatologist.   The patient and/or caregiver has been counseled thoroughly with regard to medications prescribed including dosage, schedule, interactions, rationale for use, and possible side effects and they verbalize understanding.  Diagnoses and expected course of recovery discussed and will return if not improved as expected or if the condition worsens. Patient and/or caregiver verbalized understanding.   Orders Added: 1)  Est. Patient Level III [16109]

## 2011-05-08 ENCOUNTER — Encounter: Payer: Self-pay | Admitting: *Deleted

## 2011-05-08 ENCOUNTER — Emergency Department
Admission: EM | Admit: 2011-05-08 | Discharge: 2011-05-08 | Disposition: A | Discharge status: Home Health | Payer: BC Managed Care – PPO | Source: Home / Self Care | Attending: Family Medicine | Admitting: Family Medicine

## 2011-05-08 ENCOUNTER — Emergency Department
Admit: 2011-05-08 | Discharge: 2011-05-08 | Disposition: A | Discharge status: Home / Self Care | Payer: BC Managed Care – PPO

## 2011-05-08 DIAGNOSIS — M25579 Pain in unspecified ankle and joints of unspecified foot: Secondary | ICD-10-CM

## 2011-05-08 DIAGNOSIS — M898X6 Other specified disorders of bone, lower leg: Secondary | ICD-10-CM

## 2011-05-08 DIAGNOSIS — M79609 Pain in unspecified limb: Secondary | ICD-10-CM

## 2011-05-08 NOTE — Discharge Instructions (Signed)
Begin Ibuprofen 200mg , 3 or 4 tabs every 8 hours with food.  Apply ice pack for 30 minutes, 2 or 3 times daily.  Limit athletic activity until improved. Recommend follow-up with Family Doctor and have vitamin D level checked.

## 2011-05-08 NOTE — ED Provider Notes (Signed)
History     CSN: [id number]  Arrival date & time 05/08/11  1013   First MD Initiated Contact with Patient 05/08/11 1045      Chief Complaint  Patient presents with  . Ankle Pain    left     HPI Comments: Patient states that she played tennis three days ago and noted pain in her left lateral ankle that subsided afterwards.  She recalls no injury.  She plays tennis and runs regularly, about 3 miles per day, and has noted recurrent pain in her left ankle today.  The pain is worse walking and going down stairs.  No recent increase in activity.  No response to ace wrap and ice. She states that she has a history of osteoporosis, but is not presently taking Vitamin D.  She has not had a Vit D level measured.  Patient is a 56 y.o. female presenting with ankle pain. The history is provided by the patient.  Ankle Pain This is a new problem. Episode onset: 3 days ago. Episode frequency: intermittently. The problem has been gradually worsening. Associated symptoms comments: none. The symptoms are aggravated by walking (running). The symptoms are relieved by nothing. Treatments tried: NSAID. The treatment provided mild relief.    Medical history reviewed:  osteoporosis  Past Surgical History  Procedure Date  . Appendectomy 03/25/1999  . Septoplasty May 2009    Family History  Problem Relation Age of Onset  . Skin cancer Father   . Hypertension Father   . Hypertension Mother     History  Substance Use Topics  . Smoking status: Former Smoker    Types: Cigarettes  . Smokeless tobacco: Not on file  . Alcohol Use: Yes    OB History    Grav Para Term Preterm Abortions TAB SAB Ect Mult Living                  Review of Systems  All other systems reviewed and are negative.    Allergies  Review of patient's allergies indicates no known allergies.  Home Medications   Current Outpatient Rx  Name Route Sig Dispense Refill  . ALENDRONATE SODIUM 70 MG PO TABS Oral Take 1 tablet  (70 mg total) by mouth every 7 (seven) days. Take with a full glass of water on an empty stomach. 4 tablet 11  . ZOLPIDEM TARTRATE 10 MG PO TABS Oral Take 1 tablet (10 mg total) by mouth at bedtime as needed. 30 tablet 2    BP 116/58  Pulse 61  Resp 14  Ht 5\' 1"  (1.549 m)  Wt 112 lb (50.803 kg)  BMI 21.16 kg/m2  SpO2 99%  Physical Exam  Nursing note and vitals reviewed. Constitutional: She appears well-developed and well-nourished. No distress.  Musculoskeletal:       Left ankle: She exhibits normal range of motion, no swelling, no ecchymosis, no deformity, no laceration and normal pulse. tenderness. No lateral malleolus, no medial malleolus, no AITFL, no CF ligament, no posterior TFL, no head of 5th metatarsal and no proximal fibula tenderness found. Achilles tendon normal.       Feet:       There is tenderness to palpation over distal fibula as noted on diagram, about 5cm above the lateral malleolus.  No swelling.  Distal Neurovascular function is intact.     ED Course  Procedures none  Labs Reviewed - No data to display Dg Ankle Complete Left  05/08/2011  *RADIOLOGY REPORT*  Clinical Data: Lateral  ankle pain, no injury  LEFT ANKLE COMPLETE - 3+ VIEW  Comparison: None.  Findings: The ankle joint appears normal.  No acute bony abnormality is seen.  Alignment is normal.  IMPRESSION: Negative.  Original Report Authenticated By: Juline Patch, M.D.     1. Pain in fibula       MDM  No evidence stress fracture at present.  Treat conservatively for now: Begin Ibuprofen 200mg , 3 or 4 tabs every 8 hours with food.  Apply ice pack for 30 minutes, 2 or 3 times daily.  Limit athletic activity until improved. Recommend follow-up with Family Doctor and have vitamin D level checked.  If pain persists, recommend follow-up with [facility] Clinic (rule out stress fracture).       Lattie Haw, MD 05/08/11 [phone]

## 2011-05-08 NOTE — ED Notes (Signed)
Patient c/o left ankle pain x 2-3 days. No known cause of pain/injury. Although, she plays tennis and is a runner. She ahs used an ace wrap. No previous injuries to the left ankle.

## 2011-07-03 ENCOUNTER — Encounter: Payer: Self-pay | Admitting: Family Medicine

## 2011-07-03 ENCOUNTER — Ambulatory Visit (INDEPENDENT_AMBULATORY_CARE_PROVIDER_SITE_OTHER): Payer: BC Managed Care – PPO | Admitting: Family Medicine

## 2011-07-03 ENCOUNTER — Other Ambulatory Visit: Payer: Self-pay | Admitting: *Deleted

## 2011-07-03 VITALS — BP 113/74 | HR 71 | Ht 62.0 in | Wt 114.0 lb

## 2011-07-03 DIAGNOSIS — G479 Sleep disorder, unspecified: Secondary | ICD-10-CM

## 2011-07-03 DIAGNOSIS — M8580 Other specified disorders of bone density and structure, unspecified site: Secondary | ICD-10-CM | POA: Insufficient documentation

## 2011-07-03 DIAGNOSIS — Z0000 Encounter for general adult medical examination without abnormal findings: Secondary | ICD-10-CM

## 2011-07-03 MED ORDER — ZOLPIDEM TARTRATE 10 MG PO TABS: 10.0000 mg | ORAL_TABLET | Freq: Every evening | ORAL | Status: DC | PRN

## 2011-07-03 NOTE — Progress Notes (Signed)
  Subjective:     Jordan Maynard is a 56 y.o. female and is here for a comprehensive physical exam. The patient reports no problems.  History   Social History  . Marital Status: Married    Spouse Name: Jillyn Hidden     Number of Children: 0  . Years of Education: N/A   Occupational History  . Painter/artist    .     Social History Main Topics  . Smoking status: Former Smoker    Types: Cigarettes    Quit date: 02/26/2006  . Smokeless tobacco: Not on file  . Alcohol Use: 0.5 oz/week    1 drink(s) per week  . Drug Use: No  . Sexually Active: Yes -- Female partner(s)   Other Topics Concern  . 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 She exercises regularly 3-5 days per week. . 2 caffeinated drinks per day. No children.   Health Maintenance  Topic Date Due  . Colonoscopy  06/07/2006  . Influenza Vaccine  11/27/2011  . Pap Smear  05/31/2012  . Mammogram  07/31/2012  . Tetanus/tdap  05/26/2019    The following portions of the patient's history were reviewed and updated as appropriate: allergies, current medications, past family history, past medical history, past social history, past surgical history and problem list.  Review of Systems A comprehensive review of systems was negative.   Objective:    BP 113/74  Pulse 71  Ht 5\' 2"  (1.575 m)  Wt 114 lb (51.71 kg)  BMI 20.85 kg/m2 General appearance: alert, cooperative and appears stated age Head: Normocephalic, without obvious abnormality, atraumatic Eyes: conj clear, EOMi, PEERLA Ears: normal TM's and external ear canals both ears Nose: Nares normal. Septum midline. Mucosa normal. No drainage or sinus tenderness. Throat: lips, mucosa, and tongue normal; teeth and gums normal Neck: no adenopathy, no carotid bruit, no JVD, supple, symmetrical, trachea midline and thyroid not enlarged, symmetric, no tenderness/mass/nodules Back: symmetric, no curvature.  ROM normal. No CVA tenderness. Lungs: clear to auscultation bilaterally Breasts: normal appearance, no masses or tenderness Heart: regular rate and rhythm, S1, S2 normal, no murmur, click, rub or gallop Abdomen: soft, non-tender; bowel sounds normal; no masses,  no organomegaly Extremities: extremities normal, atraumatic, no cyanosis or edema Pulses: 2+ and symmetric Skin: Skin color, texture, turgor normal. No rashes or lesions Lymph nodes: Cervical, supraclavicular, and axillary nodes normal. Neurologic: Alert and oriented X 3, normal strength and tone. Normal symmetric reflexes. Normal coordination and gait    Assessment:    Healthy female exam.      Plan:     See After Visit Summary for Counseling Recommendations   Start a regular exercise program and make sure you are eating a healthy diet Try to eat 4 servings of dairy a day or take a calcium supplement (500mg  twice a day). Your vaccines are up to date.  She is due for her DEXA scan this summer. She is to call the office we can schedule at that time. She wants to do her mammogram every other year. She had a normal mammogram last June. Discussed need for screening colonoscopy. She says she still has not had this done this as she plans to do it this year. I explained we would be happy to make a referral for her but she declined and said she will make her own appointment.  Refilled her Ambien.

## 2011-07-03 NOTE — Patient Instructions (Addendum)
Remember to schedule your colonoscopy. Please call me if you need a referral.  Start a regular exercise program and make sure you are eating a healthy diet Try to eat 4 servings of dairy a day Your vaccines are up to date.  You will be duie for your DEXA in June or July. Call me when you are ready for [place] to schedule this for you.

## 2011-09-17 ENCOUNTER — Telehealth: Payer: Self-pay | Admitting: *Deleted

## 2011-09-17 DIAGNOSIS — M810 Age-related osteoporosis without current pathological fracture: Secondary | ICD-10-CM

## 2011-09-17 NOTE — Telephone Encounter (Signed)
Pt calls and needs order for bone density, mammogram. Insurance will cover cholesterol lipid profile, blood glucose screening and hemoglobin test. So pt needs to get lipid profile done since insurance will cover this.

## 2011-09-17 NOTE — Telephone Encounter (Signed)
CMP and lipids  Lab orders were given to her in May. If she needs these reprinted we can do that. I added an order for bone density. We also already put in order of mammo. i will have JEnn check on that one. We can add A1C to labs if she would like.

## 2011-09-20 ENCOUNTER — Telehealth: Payer: Self-pay | Admitting: *Deleted

## 2011-09-20 DIAGNOSIS — Z0000 Encounter for general adult medical examination without abnormal findings: Secondary | ICD-10-CM

## 2011-09-24 LAB — LIPID PANEL
Cholesterol: 215 mg/dL — ABNORMAL HIGH (ref 0–200)
HDL: 68 mg/dL (ref >39)
LDL Cholesterol: 113 mg/dL — ABNORMAL HIGH (ref 0–99)
Total CHOL/HDL Ratio: 3.2 Ratio
Triglycerides: 172 mg/dL — ABNORMAL HIGH (ref <150)
VLDL: 34 mg/dL (ref 0–40)

## 2011-10-01 ENCOUNTER — Other Ambulatory Visit: Payer: Self-pay | Admitting: Family Medicine

## 2011-10-01 DIAGNOSIS — M810 Age-related osteoporosis without current pathological fracture: Secondary | ICD-10-CM

## 2011-10-02 ENCOUNTER — Ambulatory Visit (INDEPENDENT_AMBULATORY_CARE_PROVIDER_SITE_OTHER): Payer: BC Managed Care – PPO

## 2011-10-02 ENCOUNTER — Other Ambulatory Visit: Payer: Self-pay | Admitting: Family Medicine

## 2011-10-02 DIAGNOSIS — M810 Age-related osteoporosis without current pathological fracture: Secondary | ICD-10-CM

## 2011-10-02 DIAGNOSIS — M8580 Other specified disorders of bone density and structure, unspecified site: Secondary | ICD-10-CM

## 2011-10-24 ENCOUNTER — Encounter: Payer: Self-pay | Admitting: Family Medicine

## 2011-10-26 ENCOUNTER — Other Ambulatory Visit: Payer: Self-pay | Admitting: *Deleted

## 2011-10-26 MED ORDER — ZOLPIDEM TARTRATE 10 MG PO TABS: 10.0000 mg | ORAL_TABLET | Freq: Every evening | ORAL | Status: DC | PRN

## 2012-01-01 ENCOUNTER — Other Ambulatory Visit: Payer: Self-pay | Admitting: Family Medicine

## 2012-01-04 ENCOUNTER — Other Ambulatory Visit: Payer: Self-pay | Admitting: Family Medicine

## 2012-01-08 ENCOUNTER — Other Ambulatory Visit: Payer: Self-pay | Admitting: Family Medicine

## 2012-01-14 ENCOUNTER — Other Ambulatory Visit: Payer: Self-pay | Admitting: Family Medicine

## 2012-02-06 ENCOUNTER — Other Ambulatory Visit: Payer: Self-pay | Admitting: Family Medicine

## 2012-03-03 ENCOUNTER — Ambulatory Visit (INDEPENDENT_AMBULATORY_CARE_PROVIDER_SITE_OTHER): Payer: BC Managed Care – PPO | Admitting: Family Medicine

## 2012-03-03 ENCOUNTER — Encounter: Payer: Self-pay | Admitting: Family Medicine

## 2012-03-03 VITALS — BP 109/71 | HR 81 | Temp 98.1°F | Resp 16 | Wt 117.0 lb

## 2012-03-03 DIAGNOSIS — M7710 Lateral epicondylitis, unspecified elbow: Secondary | ICD-10-CM

## 2012-03-03 DIAGNOSIS — M7711 Lateral epicondylitis, right elbow: Secondary | ICD-10-CM | POA: Insufficient documentation

## 2012-03-03 MED ORDER — MELOXICAM 7.5 MG PO TABS: 7.5000 mg | ORAL_TABLET | Freq: Every day | ORAL | Status: DC

## 2012-03-03 NOTE — Patient Instructions (Signed)
Lateral Epicondylitis (Tennis Elbow) with Rehab Lateral epicondylitis involves inflammation and pain around the outer portion of the elbow. The pain is caused by inflammation of the tendons in the forearm that bring back (extend) the wrist. Lateral epicondylittis is also called tennis elbow, because it is very common in tennis players. However, it may occur in any individual who extends the wrist repetitively. If lateral epicondylitis is left untreated, it may become a chronic problem. SYMPTOMS   Pain, tenderness, and inflammation on the outer (lateral) side of the elbow.  Pain or weakness with gripping activities.  Pain that increases with wrist twisting motions (playing tennis, using a screwdriver, opening a door or a jar).  Pain with lifting objects, including a coffee cup. CAUSES  Lateral epicondylitis is caused by inflammation of the tendons that extend the wrist. Causes of injury may include:  Repetitive stress and strain on the muscles and tendons that extend the wrist.  Sudden change in activity level or intensity.  Incorrect grip in racquet sports.  Incorrect grip size of racquet (often too large).  Incorrect hitting position or technique (usually backhand, leading with the elbow).  Using a racket that is too heavy. RISK INCREASES WITH:  Sports or occupations that require repetitive and/or strenuous forearm and wrist movements (tennis, squash, racquetball, carpentry).  Poor wrist and forearm strength and flexibility.  Failure to warm up properly before activity.  Resuming activity before healing, rehabilitation, and conditioning are complete. PREVENTION   Warm up and stretch properly before activity.  Maintain physical fitness:  Strength, flexibility, and endurance.  Cardiovascular fitness.  Wear and use properly fitted equipment.  Learn and use proper technique and have a coach correct improper technique.  Wear a tennis elbow (counterforce) brace. PROGNOSIS   The course of this condition depends on the degree of the injury. If treated properly, acute cases (symptoms lasting less than 4 weeks) are often resolved in 2 to 6 weeks. Chronic (longer lasting cases) often resolve in 3 to 6 months, but may require physical therapy. RELATED COMPLICATIONS   Frequently recurring symptoms, resulting in a chronic problem. Properly treating the problem the first time decreases frequency of recurrence.  Chronic inflammation, scarring tendon degeneration, and partial tendon tear, requiring surgery.  Delayed healing or resolution of symptoms. TREATMENT  Treatment first involves the use of ice and medicine, to reduce pain and inflammation. Strengthening and stretching exercises may help reduce discomfort, if performed regularly. These exercises may be performed at home, if the condition is an acute injury. Chronic cases may require a referral to a physical therapist for evaluation and treatment. Your caregiver may advise a corticosteroid injection, to help reduce inflammation. Rarely, surgery is needed. MEDICATION  If pain medicine is needed, nonsteroidal anti-inflammatory medicines (aspirin and ibuprofen), or other minor pain relievers (acetaminophen), are often advised.  Do not take pain medicine for 7 days before surgery.  Prescription pain relievers may be given, if your caregiver thinks they are needed. Use only as directed and only as much as you need.  Corticosteroid injections may be recommended. These injections should be reserved only for the most severe cases, because they can only be given a certain number of times. HEAT AND COLD  Cold treatment (icing) should be applied for 10 to 15 minutes every 2 to 3 hours for inflammation and pain, and immediately after activity that aggravates your symptoms. Use ice packs or an ice massage.  Heat treatment may be used before performing stretching and strengthening activities prescribed by your   caregiver, physical  therapist, or athletic trainer. Use a heat pack or a warm water soak. SEEK MEDICAL CARE IF: Symptoms get worse or do not improve in 2 weeks, despite treatment. EXERCISES  RANGE OF MOTION (ROM) AND STRETCHING EXERCISES - Epicondylitis, Lateral (Tennis Elbow) These exercises may help you when beginning to rehabilitate your injury. Your symptoms may go away with or without further involvement from your physician, physical therapist or athletic trainer. While completing these exercises, remember:   Restoring tissue flexibility helps normal motion to return to the joints. This allows healthier, less painful movement and activity.  An effective stretch should be held for at least 30 seconds.  A stretch should never be painful. You should only feel a gentle lengthening or release in the stretched tissue. RANGE OF MOTION  Wrist Flexion, Active-Assisted  Extend your right / left elbow with your fingers pointing down.*  Gently pull the back of your hand towards you, until you feel a gentle stretch on the top of your forearm.  Hold this position for __________ seconds. Repeat __________ times. Complete this exercise __________ times per day.  *If directed by your physician, physical therapist or athletic trainer, complete this stretch with your elbow bent, rather than extended. RANGE OF MOTION  Wrist Extension, Active-Assisted  Extend your right / left elbow and turn your palm upwards.*  Gently pull your palm and fingertips back, so your wrist extends and your fingers point more toward the ground.  You should feel a gentle stretch on the inside of your forearm.  Hold this position for __________ seconds. Repeat __________ times. Complete this exercise __________ times per day. *If directed by your physician, physical therapist or athletic trainer, complete this stretch with your elbow bent, rather than extended. STRETCH - Wrist Flexion  Place the back of your right / left hand on a tabletop,  leaving your elbow slightly bent. Your fingers should point away from your body.  Gently press the back of your hand down onto the table by straightening your elbow. You should feel a stretch on the top of your forearm.  Hold this position for __________ seconds. Repeat __________ times. Complete this stretch __________ times per day.  STRETCH  Wrist Extension   Place your right / left fingertips on a tabletop, leaving your elbow slightly bent. Your fingers should point backwards.  Gently press your fingers and palm down onto the table by straightening your elbow. You should feel a stretch on the inside of your forearm.  Hold this position for __________ seconds. Repeat __________ times. Complete this stretch __________ times per day.  STRENGTHENING EXERCISES - Epicondylitis, Lateral (Tennis Elbow) These exercises may help you when beginning to rehabilitate your injury. They may resolve your symptoms with or without further involvement from your physician, physical therapist or athletic trainer. While completing these exercises, remember:   Muscles can gain both the endurance and the strength needed for everyday activities through controlled exercises.  Complete these exercises as instructed by your physician, physical therapist or athletic trainer. Increase the resistance and repetitions only as guided.  You may experience muscle soreness or fatigue, but the pain or discomfort you are trying to eliminate should never worsen during these exercises. If this pain does get worse, stop and make sure you are following the directions exactly. If the pain is still present after adjustments, discontinue the exercise until you can discuss the trouble with your caregiver. STRENGTH Wrist Flexors  Sit with your right / left forearm palm-up and   fully supported on a table or countertop. Your elbow should be resting below the height of your shoulder. Allow your wrist to extend over the edge of the  surface.  Loosely holding a __________ weight, or a piece of rubber exercise band or tubing, slowly curl your hand up toward your forearm.  Hold this position for __________ seconds. Slowly lower the wrist back to the starting position in a controlled manner. Repeat __________ times. Complete this exercise __________ times per day.  STRENGTH  Wrist Extensors  Sit with your right / left forearm palm-down and fully supported on a table or countertop. Your elbow should be resting below the height of your shoulder. Allow your wrist to extend over the edge of the surface.  Loosely holding a __________ weight, or a piece of rubber exercise band or tubing, slowly curl your hand up toward your forearm.  Hold this position for __________ seconds. Slowly lower the wrist back to the starting position in a controlled manner. Repeat __________ times. Complete this exercise __________ times per day.  STRENGTH - Ulnar Deviators  Stand with a ____________________ weight in your right / left hand, or sit while holding a rubber exercise band or tubing, with your healthy arm supported on a table or countertop.  Move your wrist, so that your pinkie travels toward your forearm and your thumb moves away from your forearm.  Hold this position for __________ seconds and then slowly lower the wrist back to the starting position. Repeat __________ times. Complete this exercise __________ times per day STRENGTH - Radial Deviators  Stand with a ____________________ weight in your right / left hand, or sit while holding a rubber exercise band or tubing, with your injured arm supported on a table or countertop.  Raise your hand upward in front of you or pull up on the rubber tubing.  Hold this position for __________ seconds and then slowly lower the wrist back to the starting position. Repeat __________ times. Complete this exercise __________ times per day. STRENGTH  Forearm Supinators   Sit with your right /  left forearm supported on a table, keeping your elbow below shoulder height. Rest your hand over the edge, palm down.  Gently grip a hammer or a soup ladle.  Without moving your elbow, slowly turn your palm and hand upward to a "thumbs-up" position.  Hold this position for __________ seconds. Slowly return to the starting position. Repeat __________ times. Complete this exercise __________ times per day.  STRENGTH  Forearm Pronators   Sit with your right / left forearm supported on a table, keeping your elbow below shoulder height. Rest your hand over the edge, palm up.  Gently grip a hammer or a soup ladle.  Without moving your elbow, slowly turn your palm and hand upward to a "thumbs-up" position.  Hold this position for __________ seconds. Slowly return to the starting position. Repeat __________ times. Complete this exercise __________ times per day.  STRENGTH - Grip  Grasp a tennis ball, a dense sponge, or a large, rolled sock in your hand.  Squeeze as hard as you can, without increasing any pain.  Hold this position for __________ seconds. Release your grip slowly. Repeat __________ times. Complete this exercise __________ times per day.  STRENGTH - Elbow Extensors, Isometric  Stand or sit upright, on a firm surface. Place your right / left arm so that your palm faces your stomach, and it is at the height of your waist.  Place your opposite hand on   the underside of your forearm. Gently push up as your right / left arm resists. Push as hard as you can with both arms, without causing any pain or movement at your right / left elbow. Hold this stationary position for __________ seconds. Gradually release the tension in both arms. Allow your muscles to relax completely before repeating. Document Released: 02/12/2005 Document Revised: 05/07/2011 Document Reviewed: 05/27/2008 [employer] Patient Information 2013 [employer].  

## 2012-03-03 NOTE — Progress Notes (Signed)
Subjective:    Patient ID: Jordan Maynard, female    DOB: 1955-05-08, 57 y.o.   MRN: [id number]  HPI Right elbow pain for 2 months. Thinks it started while playing tennis. Feels better with some movemetn. Worse when rest it too much.  Worse when stretches her fingers out. Says has backed off her playing tennis. Has been icing it, arnica oil and kinesio tape with minimal relief.  Uses Advil some as well.  Pain does seem to come and go.  She does wear an elbow starp when she plays.    Review of Systems     BP 109/71  Pulse 81  Temp 98.1 F (36.7 C) (Oral)  Resp 16  Wt 117 lb (53.071 kg)  SpO2 96%    No Known Allergies  No past medical history on file.  Past Surgical History  Procedure Date  . Septoplasty May 2009    History   Social History  . Marital Status: Married    Spouse Name: Jillyn Hidden     Number of Children: 0  . Years of Education: N/A   Occupational History  . Painter/artist    .     Social History Main Topics  . Smoking status: Former Smoker    Types: Cigarettes    Quit date: 02/26/2006  . Smokeless tobacco: Not on file  . Alcohol Use: 0.5 oz/week    1 drink(s) per week  . Drug Use: No  . Sexually Active: Yes -- Female partner(s)   Other Topics Concern  . 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 She exercises regularly 3-5 days per week. . 2 caffeinated drinks per day. No children.    Family History  Problem Relation Age of Onset  . Skin cancer Father   . Hypertension Father   . Hypertension Mother     Outpatient Encounter Prescriptions as of 03/03/2012  Medication Sig Dispense Refill  . acyclovir (ZOVIRAX) 400 MG tablet TAKE 1 TABLET THREE TIMES A DAY  15 tablet  0  . calcium-vitamin D (OSCAL WITH D) 500-200 MG-UNIT per tablet Take 1 tablet by mouth daily.      Marland Kitchen zolpidem (AMBIEN) 10 MG tablet Take 1 tablet (10 mg total) by mouth at bedtime as needed.  30 tablet  1    . meloxicam (MOBIC) 7.5 MG tablet Take 1 tablet (7.5 mg total) by mouth daily.  30 tablet  0  . [DISCONTINUED] zolpidem (AMBIEN) 10 MG tablet Take 1 tablet (10 mg total) by mouth at bedtime as needed for sleep.  30 tablet  0  . [DISCONTINUED] zolpidem (AMBIEN) 10 MG tablet Take 1 tablet (10 mg total) by mouth at bedtime as needed for sleep.  30 tablet  0  . [DISCONTINUED] zolpidem (AMBIEN) 10 MG tablet TAKE 1 TABLET BY MOUTH AT BEDTIME AS NEEDED FOR SLEEP  30 tablet  0  . [DISCONTINUED] zolpidem (AMBIEN) 10 MG tablet TAKE 1 TABLET BY MOUTH AT BEDTIME AS NEEDED FOR SLEEP  30 tablet  0       Objective:   Physical Exam  Musculoskeletal:       Tender over lateral epicondyle is very tender and mildly swollen.  Pain with pronation against resistance.Strength is 5/5.  Shoulder, elbow and wrist with NROM. RAdial Pulse 2+.           Assessment & Plan:  Right lateral epicondylitis - discussed her  diagnosis. She is already using a when necessary anti-inflammatory. She does her elbow strap some. She has been icing it and taking it. We discussed putting her on Mobic for at least 2 weeks. After about 3 days on the anti-inflammatory I would like her to start a stretching exercises given to her. We also discussed possibly a steroid injection but she declined today. She says she can mentally clear up or something like that and will let me know in the next couple weeks if she's not improving. Make sure taking the Prempro food and stop immediately if any GI upset. Handout given on exercises.

## 2012-03-10 ENCOUNTER — Other Ambulatory Visit: Payer: Self-pay | Admitting: Family Medicine

## 2012-04-03 ENCOUNTER — Ambulatory Visit (INDEPENDENT_AMBULATORY_CARE_PROVIDER_SITE_OTHER): Payer: BC Managed Care – PPO | Admitting: Family Medicine

## 2012-04-03 ENCOUNTER — Encounter: Payer: Self-pay | Admitting: Family Medicine

## 2012-04-03 VITALS — BP 115/68 | HR 70 | Ht 61.0 in | Wt 115.0 lb

## 2012-04-03 DIAGNOSIS — F40243 Fear of flying: Secondary | ICD-10-CM

## 2012-04-03 DIAGNOSIS — F40298 Other specified phobia: Secondary | ICD-10-CM

## 2012-04-03 MED ORDER — ALPRAZOLAM 0.5 MG PO TABS: 0.2500 mg | ORAL_TABLET | Freq: Every day | ORAL | Status: DC | PRN

## 2012-04-03 NOTE — Progress Notes (Signed)
  Subjective:    Patient ID: Jordan Maynard, female    DOB: February 23, 1956, 57 y.o.   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  She has a fear of flying and a long flight. She says she tries to avoid at all costs. She's never taken medication to relax her except for before a surgery several years ago.   Review of Systems     Objective:   Physical Exam  Constitutional: She is oriented to person, place, and time. She appears well-developed and well-nourished.  HENT:  Head: Normocephalic and atraumatic.  Cardiovascular: Normal rate, regular rhythm and normal heart sounds.   Pulmonary/Chest: Effort normal and breath sounds normal.  Neurological: She is alert and oriented to person, place, and time.  Skin: Skin is warm and dry.  Psychiatric: She has a normal mood and affect. Her behavior is normal.          Assessment & Plan:  Fear of flying - we discussed several strategies such as listening to music and staying active mentally to help distract her. We also discussed using a benzodiazepine at a time to relax herself. Did warn about the addictive potential of these medications and gave her a small quantity to use. We also discussed that she can repeat the dose in 46 hours. I encouraged her to start with half of the tabs and she is 90 these medications.

## 2012-05-12 ENCOUNTER — Other Ambulatory Visit: Payer: Self-pay | Admitting: Family Medicine

## 2012-05-14 ENCOUNTER — Other Ambulatory Visit: Payer: Self-pay | Admitting: Family Medicine

## 2012-05-18 ENCOUNTER — Other Ambulatory Visit: Payer: Self-pay | Admitting: Family Medicine

## 2012-06-30 ENCOUNTER — Encounter: Payer: Self-pay | Admitting: Family Medicine

## 2012-06-30 ENCOUNTER — Ambulatory Visit (INDEPENDENT_AMBULATORY_CARE_PROVIDER_SITE_OTHER): Payer: BC Managed Care – PPO

## 2012-06-30 ENCOUNTER — Ambulatory Visit (INDEPENDENT_AMBULATORY_CARE_PROVIDER_SITE_OTHER): Payer: BC Managed Care – PPO | Admitting: Family Medicine

## 2012-06-30 ENCOUNTER — Other Ambulatory Visit (HOSPITAL_COMMUNITY)
Admission: RE | Admit: 2012-06-30 | Discharge: 2012-06-30 | Disposition: A | Discharge status: Home / Self Care | Payer: BC Managed Care – PPO | Source: Ambulatory Visit | Attending: Family Medicine | Admitting: Family Medicine

## 2012-06-30 VITALS — BP 122/69 | HR 61 | Wt 115.0 lb

## 2012-06-30 DIAGNOSIS — Z01419 Encounter for gynecological examination (general) (routine) without abnormal findings: Secondary | ICD-10-CM | POA: Insufficient documentation

## 2012-06-30 DIAGNOSIS — M545 Low back pain, unspecified: Secondary | ICD-10-CM

## 2012-06-30 DIAGNOSIS — Z124 Encounter for screening for malignant neoplasm of cervix: Secondary | ICD-10-CM

## 2012-06-30 DIAGNOSIS — Z1151 Encounter for screening for human papillomavirus (HPV): Secondary | ICD-10-CM | POA: Insufficient documentation

## 2012-06-30 DIAGNOSIS — M51379 Other intervertebral disc degeneration, lumbosacral region without mention of lumbar back pain or lower extremity pain: Secondary | ICD-10-CM

## 2012-06-30 DIAGNOSIS — M5137 Other intervertebral disc degeneration, lumbosacral region: Secondary | ICD-10-CM

## 2012-06-30 DIAGNOSIS — Z1211 Encounter for screening for malignant neoplasm of colon: Secondary | ICD-10-CM

## 2012-06-30 MED ORDER — MELOXICAM 7.5 MG PO TABS: 7.5000 mg | ORAL_TABLET | Freq: Every day | ORAL | Status: DC

## 2012-06-30 NOTE — Progress Notes (Signed)
Subjective:    Patient ID: Jordan Maynard, female    DOB: 1955-06-10, 57 y.o.   MRN: [id number]  HPI    Review of Systems     Objective:   Physical Exam        Assessment & Plan:   Subjective:     Jordan Maynard is a 57 y.o. female and is here for a comprehensive physical exam. The patient reports problems - still some low back. Using mobic for that and is helping. Gettins some massages and doing yog and gets some relief but doesn't stay well. Trouble getting in and out of the car and getting in and out of bed.  .  History   Social History  . Marital Status: Married    Spouse Name: Jillyn Hidden     Number of Children: 0  . Years of Education: N/A   Occupational History  . Painter/artist    .     Social History Main Topics  . Smoking status: Former Smoker    Types: Cigarettes    Quit date: 02/26/2006  . Smokeless tobacco: Not on file  . Alcohol Use: 0.5 oz/week    1 drink(s) per week  . Drug Use: No  . Sexually Active: Yes -- Female partner(s)   Other Topics Concern  . 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 She exercises regularly 3-5 days per week. . 2 caffeinated drinks per day. No children.   Health Maintenance  Topic Date Due  . Colonoscopy  06/07/2006  . Pap Smear  05/31/2012  . Influenza Vaccine  10/27/2012  . Mammogram  10/10/2013  . Tetanus/tdap  05/26/2019    The following portions of the patient's history were reviewed and updated as appropriate: allergies, current medications, past family history, past medical history, past social history, past surgical history and problem list.  Review of Systems A comprehensive review of systems was negative.   Objective:    BP 122/69  Pulse 61  Wt 115 lb (52.164 kg)  BMI 21.74 kg/m2 General appearance: alert and cooperative Head: Normocephalic, without obvious abnormality, atraumatic Eyes: conjunctivae/corneas clear. PERRL,  EOM's intact. Fundi benign. Ears: normal TM's and external ear canals both ears Nose: Nares normal. Septum midline. Mucosa normal. No drainage or sinus tenderness. Throat: lips, mucosa, and tongue normal; teeth and gums normal Neck: no adenopathy, no carotid bruit, no JVD, supple, symmetrical, trachea midline and thyroid not enlarged, symmetric, no tenderness/mass/nodules Back: symmetric, no curvature. ROM normal. No CVA tenderness. Lungs: clear to auscultation bilaterally Breasts: normal appearance, no masses or tenderness Heart: regular rate and rhythm, S1, S2 normal, no murmur, click, rub or gallop Abdomen: soft, non-tender; bowel sounds normal; no masses,  no organomegaly Pelvic: cervix normal in appearance, external genitalia normal, no adnexal masses or tenderness, no cervical motion tenderness, rectovaginal septum normal, uterus normal size, shape, and consistency and vagina normal without discharge Extremities: extremities normal, atraumatic, no cyanosis or edema Pulses: 2+ and symmetric Skin: Skin color, texture, turgor normal. No rashes or lesions Lymph nodes: Cervical, supraclavicular, and axillary nodes normal. Neurologic: Alert and oriented X 3, normal strength and tone. Normal symmetric reflexes. Normal coordination and gait   Nontender over the SI joints. She is mildly tender along the paraspinous muscles just above the SI joints. No radiation of pain. Normal range of motion.   Assessment:    Healthy female exam.  Plan:     See After Visit Summary for Counseling Recommendations  Keep up a regular exercise program and make sure you are eating a healthy diet Try to eat 4 servings of dairy a day, or if you are lactose intolerant take a calcium with vitamin D daily.  Your vaccines are up to date.   Low back pain x 3 months.  Pain is mostly in the middle. Harder to bend to the left. No radiatoin of pain. Will send over rx for mobic.    Will get xray today sinc epain has  persisted. No known trauma or injury. She is Re: tried massage therapy and yoga and stretching. We discussed options of possible physical therapy if her x-ray is normal persisting my partner Dr. Rodney Langton for further evaluation and treatment.

## 2012-07-10 ENCOUNTER — Other Ambulatory Visit: Payer: Self-pay | Admitting: Family Medicine

## 2012-07-15 ENCOUNTER — Other Ambulatory Visit: Payer: Self-pay | Admitting: Family Medicine

## 2012-08-04 ENCOUNTER — Other Ambulatory Visit: Payer: Self-pay | Admitting: Family Medicine

## 2012-08-13 ENCOUNTER — Encounter: Payer: Self-pay | Admitting: Sports Medicine

## 2012-08-13 ENCOUNTER — Ambulatory Visit (INDEPENDENT_AMBULATORY_CARE_PROVIDER_SITE_OTHER): Payer: BC Managed Care – PPO | Admitting: Sports Medicine

## 2012-08-13 VITALS — BP 104/64 | HR 61 | Wt 115.0 lb

## 2012-08-13 DIAGNOSIS — M5136 Other intervertebral disc degeneration, lumbar region: Secondary | ICD-10-CM | POA: Insufficient documentation

## 2012-08-13 DIAGNOSIS — M51379 Other intervertebral disc degeneration, lumbosacral region without mention of lumbar back pain or lower extremity pain: Secondary | ICD-10-CM

## 2012-08-13 DIAGNOSIS — M5137 Other intervertebral disc degeneration, lumbosacral region: Secondary | ICD-10-CM

## 2012-08-13 DIAGNOSIS — M51369 Other intervertebral disc degeneration, lumbar region without mention of lumbar back pain or lower extremity pain: Secondary | ICD-10-CM | POA: Insufficient documentation

## 2012-08-13 MED ORDER — PREDNISONE 50 MG PO TABS: ORAL_TABLET | ORAL | Status: DC

## 2012-08-13 MED ORDER — MELOXICAM 15 MG PO TABS: ORAL_TABLET | ORAL | Status: DC

## 2012-08-13 NOTE — Progress Notes (Signed)
   Subjective:    I'm seeing this patient as a consultation for:  Dr. Linford Arnold  CC: Low back pain  HPI: This is a very pleasant 47 white female comes in with an on-and-off history for a long time of pain which he localizes in the mid low back, without radiation. Pain is worse with flexion, worse with Valsalva, there are no radicular symptoms. She denies any trauma, no constitutional symptoms, no loss of bowel or bladder dysfunction and no saddle anesthesia. Pain is described as sharp, does not wake her up from sleep. It is mild to moderate at this point. Stable.  Past medical history, Surgical history, Family history not pertinant except as noted below, Social history, Allergies, and medications have been entered into the medical record, reviewed, and no changes needed.   Review of Systems: No headache, visual changes, nausea, vomiting, diarrhea, constipation, dizziness, abdominal pain, skin rash, fevers, chills, night sweats, weight loss, swollen lymph nodes, body aches, joint swelling, muscle aches, chest pain, shortness of breath, mood changes, visual or auditory hallucinations.   Objective:   General: Well Developed, well nourished, and in no acute distress.  Neuro/Psych: Alert and oriented x3, extra-ocular muscles intact, able to move all 4 extremities, sensation grossly intact. Skin: Warm and dry, no rashes noted.  Respiratory: Not using accessory muscles, speaking in full sentences, trachea midline.  Cardiovascular: Pulses palpable, no extremity edema. Abdomen: Does not appear distended. Back Exam:  Inspection: Unremarkable  Motion: Flexion 45 deg, Extension 45 deg, Side Bending to 45 deg bilaterally,  Rotation to 45 deg bilaterally  SLR laying: Negative  XSLR laying: Negative  Palpable tenderness: None. FABER: negative. Sensory change: Gross sensation intact to all lumbar and sacral dermatomes.  Reflexes: 2+ at both patellar tendons, 2+ at achilles tendons, Babinski's downgoing.   Strength at foot  Plantar-flexion: 5/5 Dorsi-flexion: 5/5 Eversion: 5/5 Inversion: 5/5  Leg strength  Quad: 5/5 Hamstring: 5/5 Hip flexor: 5/5 Hip abductors: 5/5  Gait unremarkable.  X-rays reviewed show L4-L5 degenerative disc disease.  Impression and Recommendations:   This case required medical decision making of moderate complexity.

## 2012-08-13 NOTE — Assessment & Plan Note (Signed)
L4-L5 degenerative disc disease on x-ray. Symptoms are all axial. Physical therapy, prednisone, Mobic. Return in 4 weeks, MRI if no better.

## 2012-08-18 ENCOUNTER — Ambulatory Visit: Payer: BC Managed Care – PPO | Admitting: Physical Therapy

## 2012-08-18 DIAGNOSIS — M545 Low back pain, unspecified: Secondary | ICD-10-CM

## 2012-08-18 DIAGNOSIS — M6281 Muscle weakness (generalized): Secondary | ICD-10-CM

## 2012-08-18 DIAGNOSIS — M51379 Other intervertebral disc degeneration, lumbosacral region without mention of lumbar back pain or lower extremity pain: Secondary | ICD-10-CM

## 2012-08-18 DIAGNOSIS — M5137 Other intervertebral disc degeneration, lumbosacral region: Secondary | ICD-10-CM

## 2012-08-20 ENCOUNTER — Encounter: Payer: BC Managed Care – PPO | Admitting: Physical Therapy

## 2012-08-20 DIAGNOSIS — M545 Low back pain, unspecified: Secondary | ICD-10-CM

## 2012-08-20 DIAGNOSIS — M6281 Muscle weakness (generalized): Secondary | ICD-10-CM

## 2012-08-20 DIAGNOSIS — M51379 Other intervertebral disc degeneration, lumbosacral region without mention of lumbar back pain or lower extremity pain: Secondary | ICD-10-CM

## 2012-08-20 DIAGNOSIS — M5137 Other intervertebral disc degeneration, lumbosacral region: Secondary | ICD-10-CM

## 2012-08-27 ENCOUNTER — Encounter: Payer: BC Managed Care – PPO | Admitting: Physical Therapy

## 2012-08-27 DIAGNOSIS — M5137 Other intervertebral disc degeneration, lumbosacral region: Secondary | ICD-10-CM

## 2012-08-27 DIAGNOSIS — M545 Low back pain, unspecified: Secondary | ICD-10-CM

## 2012-08-27 DIAGNOSIS — M6281 Muscle weakness (generalized): Secondary | ICD-10-CM

## 2012-09-03 ENCOUNTER — Encounter: Payer: BC Managed Care – PPO | Admitting: Physical Therapy

## 2012-09-03 DIAGNOSIS — M5137 Other intervertebral disc degeneration, lumbosacral region: Secondary | ICD-10-CM

## 2012-09-03 DIAGNOSIS — M545 Low back pain, unspecified: Secondary | ICD-10-CM

## 2012-09-03 DIAGNOSIS — M6281 Muscle weakness (generalized): Secondary | ICD-10-CM

## 2012-11-10 ENCOUNTER — Other Ambulatory Visit: Payer: Self-pay | Admitting: Sports Medicine

## 2012-11-13 ENCOUNTER — Other Ambulatory Visit: Payer: Self-pay | Admitting: Sports Medicine

## 2012-11-13 NOTE — Telephone Encounter (Signed)
This looks like it was RE filled on the 15th. Please call the pharmacy to verify that they got the prescription.

## 2013-01-01 ENCOUNTER — Other Ambulatory Visit: Payer: Self-pay

## 2013-01-13 ENCOUNTER — Other Ambulatory Visit: Payer: Self-pay | Admitting: Family Medicine

## 2013-01-14 ENCOUNTER — Telehealth: Payer: Self-pay | Admitting: Family Medicine

## 2013-01-14 NOTE — Telephone Encounter (Signed)
Please call patient. Normal mammogram.  Repeat in 1 year.  

## 2013-01-20 NOTE — Telephone Encounter (Signed)
lvm w/ results.Jordan Maynard Lynetta  

## 2013-01-30 ENCOUNTER — Encounter: Payer: Self-pay | Admitting: Family Medicine

## 2013-03-17 ENCOUNTER — Other Ambulatory Visit: Payer: Self-pay | Admitting: Family Medicine

## 2013-03-17 NOTE — Telephone Encounter (Signed)
Hasn't been seen by you since 06/30/2012.  Are you ok to fill and needs appointment

## 2013-05-15 ENCOUNTER — Other Ambulatory Visit: Payer: Self-pay | Admitting: Family Medicine

## 2013-05-19 ENCOUNTER — Other Ambulatory Visit: Payer: Self-pay | Admitting: Family Medicine

## 2013-05-25 ENCOUNTER — Other Ambulatory Visit: Payer: Self-pay | Admitting: Family Medicine

## 2013-06-01 ENCOUNTER — Other Ambulatory Visit: Payer: Self-pay | Admitting: Family Medicine

## 2013-06-09 ENCOUNTER — Ambulatory Visit (INDEPENDENT_AMBULATORY_CARE_PROVIDER_SITE_OTHER): Payer: BC Managed Care – PPO | Admitting: Family Medicine

## 2013-06-09 ENCOUNTER — Encounter: Payer: Self-pay | Admitting: Family Medicine

## 2013-06-09 VITALS — BP 113/79 | HR 63 | Ht 61.0 in | Wt 118.0 lb

## 2013-06-09 DIAGNOSIS — E785 Hyperlipidemia, unspecified: Secondary | ICD-10-CM

## 2013-06-09 DIAGNOSIS — Z1211 Encounter for screening for malignant neoplasm of colon: Secondary | ICD-10-CM

## 2013-06-09 DIAGNOSIS — Z0000 Encounter for general adult medical examination without abnormal findings: Secondary | ICD-10-CM

## 2013-06-09 LAB — LIPID PANEL
Cholesterol: 273 mg/dL — ABNORMAL HIGH (ref 0–200)
HDL: 75 mg/dL (ref >39)
LDL Cholesterol: 169 mg/dL — ABNORMAL HIGH (ref 0–99)
Total CHOL/HDL Ratio: 3.6 Ratio
Triglycerides: 143 mg/dL (ref <150)
VLDL: 29 mg/dL (ref 0–40)

## 2013-06-09 LAB — COMPLETE METABOLIC PANEL WITH GFR
ALT: 14 U/L (ref 0–35)
AST: 15 U/L (ref 0–37)
Albumin: 4.2 g/dL (ref 3.5–5.2)
Alkaline Phosphatase: 57 U/L (ref 39–117)
BUN: 16 mg/dL (ref 6–23)
CO2: 31 mEq/L (ref 19–32)
Calcium: 9.7 mg/dL (ref 8.4–10.5)
Chloride: 101 mEq/L (ref 96–112)
Creat: 0.79 mg/dL (ref 0.50–1.10)
GFR, Est African American: >89 mL/min
GFR, Est Non African American: 83 mL/min
Glucose, Bld: 87 mg/dL (ref 70–99)
Potassium: 5 mEq/L (ref 3.5–5.3)
Sodium: 139 mEq/L (ref 135–145)
Total Bilirubin: 0.4 mg/dL (ref 0.2–1.2)
Total Protein: 6.8 g/dL (ref 6.0–8.3)

## 2013-06-09 MED ORDER — ZOLPIDEM TARTRATE 10 MG PO TABS: ORAL_TABLET | ORAL | Status: DC

## 2013-06-09 MED ORDER — ALPRAZOLAM 0.5 MG PO TABS: 0.2500 mg | ORAL_TABLET | Freq: Every day | ORAL | Status: DC | PRN

## 2013-06-09 NOTE — Progress Notes (Signed)
Subjective:     Jordan SchleinDana Maynard is a 58 y.o. female and is here for a comprehensive physical exam. The patient reports no problems. Feels like her balance has been off for a year.  No ear pain or problems. No hearing loss Does have spring allergies but this started before that. Says plans ton seeing a new eye doctor today. Seems like her vision is not right and keep changing.  No vertigo or dizziness.    History   Social History  . Marital Status: Married    Spouse Name: Jordan HiddenGary     Number of Children: 0  . Years of Education: N/A   Occupational History  . Painter/artist    .     Social History Main Topics  . Smoking status: Former Smoker    Types: Cigarettes    Quit date: 02/26/2006  . Smokeless tobacco: Not on file  . Alcohol Use: 0.5 oz/week    1 drink(s) per week  . Drug Use: No  . Sexual Activity: Yes    Partners: Male   Other Topics Concern  . 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 She exercises regularly 3-5 days per week. . 2 caffeinated drinks per day. No children.   Health Maintenance  Topic Date Due  . Colonoscopy  06/07/2006  . Influenza Vaccine  09/26/2013  . Mammogram  01/14/2015  . Pap Smear  07/01/2015  . Tetanus/tdap  05/26/2019    The following portions of the patient's history were reviewed and updated as appropriate: allergies, current medications, past family history, past medical history, past social history, past surgical history and problem list.  Review of Systems A comprehensive review of systems was negative.   Objective:    BP 113/79  Pulse 63  Ht 5\' 1"  (1.549 m)  Wt 118 lb (53.524 kg)  BMI 22.31 kg/m2 General appearance: alert, cooperative and appears stated age Head: Normocephalic, without obvious abnormality, atraumatic Eyes: conj clear, EOMI, PEERLA Ears: normal TM's and external ear canals both ears Nose: Nares normal. Septum midline. Mucosa  normal. No drainage or sinus tenderness. Throat: lips, mucosa, and tongue normal; teeth and gums normal Neck: no adenopathy, no carotid bruit, no JVD, supple, symmetrical, trachea midline and thyroid not enlarged, symmetric, no tenderness/mass/nodules Back: symmetric, no curvature. ROM normal. No CVA tenderness. Lungs: clear to auscultation bilaterally Breasts: normal appearance, no masses or tenderness Heart: regular rate and rhythm, S1, S2 normal, no murmur, click, rub or gallop Abdomen: soft, non-tender; bowel sounds normal; no masses,  no organomegaly Extremities: extremities normal, atraumatic, no cyanosis or edema Pulses: 2+ and symmetric Skin: Skin color, texture, turgor normal. No rashes or lesions Lymph nodes: Cervical, supraclavicular, and axillary nodes normal. Neurologic: Alert and oriented X 3, normal strength and tone. Normal symmetric reflexes. Normal coordination and gait    Assessment:    Healthy female exam.      Plan:     See After Visit Summary for Counseling Recommendations  Keep up a regular exercise program and make sure you are eating a healthy diet Try to eat 4 servings of dairy a day, or if you are lactose intolerant take a calcium with vitamin D daily.  Your vaccines are up to date.   Due for mammo next year.  Due for CMP and lipids.   Balance and vision changes - She has eye appt later today. If this doesn't correct  the vision and balance issues then please let me know and we can workup this further. Her exam is normal today.  Due for screening colon cancer screening - Will place referrral. Unfortunately she was not contacted last year.

## 2013-06-09 NOTE — Patient Instructions (Addendum)
Due bone density after 10/01/2013. Call when ready to do it.   Keep up a regular exercise program and make sure you are eating a healthy diet Try to eat 4 servings of dairy a day, or if you are lactose intolerant take a calcium with vitamin D daily.  Your vaccines are up to date.

## 2013-06-11 ENCOUNTER — Other Ambulatory Visit: Payer: Self-pay | Admitting: *Deleted

## 2013-06-11 DIAGNOSIS — E785 Hyperlipidemia, unspecified: Secondary | ICD-10-CM

## 2013-09-03 ENCOUNTER — Ambulatory Visit (INDEPENDENT_AMBULATORY_CARE_PROVIDER_SITE_OTHER): Payer: BC Managed Care – PPO | Admitting: Family Medicine

## 2013-09-03 ENCOUNTER — Encounter: Payer: Self-pay | Admitting: Family Medicine

## 2013-09-03 VITALS — BP 125/76 | HR 61 | Ht 61.0 in | Wt 114.0 lb

## 2013-09-03 DIAGNOSIS — E785 Hyperlipidemia, unspecified: Secondary | ICD-10-CM

## 2013-09-03 DIAGNOSIS — R42 Dizziness and giddiness: Secondary | ICD-10-CM

## 2013-09-03 MED ORDER — PREDNISONE 20 MG PO TABS: 40.0000 mg | ORAL_TABLET | Freq: Every day | ORAL | Status: DC

## 2013-09-03 NOTE — Patient Instructions (Signed)
Start claritin Start the exercises If not better in 2 week then let me know. Benign Positional Vertigo Vertigo means you feel like you or your surroundings are moving when they are not. Benign positional vertigo is the most common form of vertigo. Benign means that the cause of your condition is not serious. Benign positional vertigo is more common in older adults. CAUSES  Benign positional vertigo is the result of an upset in the labyrinth system. This is an area in the middle ear that helps control your balance. This may be caused by a viral infection, head injury, or repetitive motion. However, often no specific cause is found. SYMPTOMS  Symptoms of benign positional vertigo occur when you move your head or eyes in different directions. Some of the symptoms may include:  Loss of balance and falls.  Vomiting.  Blurred vision.  Dizziness.  Nausea.  Involuntary eye movements (nystagmus). DIAGNOSIS  Benign positional vertigo is usually diagnosed by physical exam. If the specific cause of your benign positional vertigo is unknown, your caregiver may perform imaging tests, such as magnetic resonance imaging (MRI) or computed tomography (CT). TREATMENT  Your caregiver may recommend movements or procedures to correct the benign positional vertigo. Medicines such as meclizine, benzodiazepines, and medicines for nausea may be used to treat your symptoms. In rare cases, if your symptoms are caused by certain conditions that affect the inner ear, you may need surgery. HOME CARE INSTRUCTIONS   Follow your caregiver's instructions.  Move slowly. Do not make sudden body or head movements.  Avoid driving.  Avoid operating heavy machinery.  Avoid performing any tasks that would be dangerous to you or others during a vertigo episode.  Drink enough fluids to keep your urine clear or pale yellow. SEEK IMMEDIATE MEDICAL CARE IF:   You develop problems with walking, weakness, numbness, or using  your arms, hands, or legs.  You have difficulty speaking.  You develop severe headaches.  Your nausea or vomiting continues or gets worse.  You develop visual changes.  Your family or friends notice any behavioral changes.  Your condition gets worse.  You have a fever.  You develop a stiff neck or sensitivity to light. MAKE SURE YOU:   Understand these instructions.  Will watch your condition.  Will get help right away if you are not doing well or get worse. Document Released: 11/20/2005 Document Revised: 05/07/2011 Document Reviewed: 11/02/2010 [facility] Patient Information 2015 [employer] [place]. This information is not intended to replace advice given to you by your health care provider. Make sure you discuss any questions you have with your health care provider.

## 2013-09-03 NOTE — Progress Notes (Signed)
Subjective:    Patient ID: Jordan Maynard, female    DOB: Jun 04, 1955, 58 y.o.   MRN: [id number]  HPI          balance issues left ear pressure. when she turns her head she gets unbalanced   Says if shakes her head or turns quickly she will feels dizzy for a few seconds.  Sometimes feels a pressure or tickle in the left ear.  Feels like there is something in there.  Says that feeling is constant.  Started about 3 months ago.  No injury to ear or head.  Felt her balance has been off.  No recent URI sxs. Went to her eye doctor to make sure that her vision was okay. She does use a nasal steroid spray as needed for allergies. She's not taking Antihistamine. She denies any significant nasal congestion recently. No drainage from the ears. No fevers chills or sweats. She denies any hearing loss.   Review of Systems BP 125/76  Pulse 61  Ht 5\' 1"  (1.549 m)  Wt 114 lb (51.71 kg)  BMI 21.55 kg/m2    No Known Allergies  No past medical history on file.  Past Surgical History  Procedure Laterality Date  . Septoplasty  May 2009    History   Social History  . Marital Status: Married    Spouse Name: Jordan Maynard     Number of Children: 0  . Years of Education: N/A   Occupational History  . Painter/artist    .     Social History Main Topics  . Smoking status: Former Smoker    Types: Cigarettes    Quit date: 02/26/2006  . Smokeless tobacco: Not on file  . Alcohol Use: 0.5 oz/week    1 drink(s) per week  . Drug Use: No  . Sexual Activity: Yes    Partners: Male   Other Topics Concern  . 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 She exercises regularly 3-5 days per week. . 2 caffeinated drinks per day. No children.    Family History  Problem Relation Age of Onset  . Skin cancer Father   . Hypertension Father   . Hypertension Mother     Outpatient Encounter Prescriptions as of 09/03/2013  Medication  Sig  . acyclovir (ZOVIRAX) 400 MG tablet TAKE 1 TABLET THREE TIMES A DAY  . ALPRAZolam (XANAX) 0.5 MG tablet Take 0.5-1 tablets (0.25-0.5 mg total) by mouth daily as needed for anxiety.  . calcium-vitamin D (OSCAL WITH D) 500-200 MG-UNIT per tablet Take 1 tablet by mouth daily.  Marland Kitchen. zolpidem (AMBIEN) 10 MG tablet TAKE 1 TABLET AT BEDTIME AS NEEDED SLEEP  . predniSONE (DELTASONE) 20 MG tablet Take 2 tablets (40 mg total) by mouth daily.          Objective:   Physical Exam  Constitutional: She is oriented to person, place, and time. She appears well-developed and well-nourished.  HENT:  Head: Normocephalic and atraumatic.  Right Ear: External ear normal.  Left Ear: External ear normal.  Nose: Nose normal.  Mouth/Throat: Oropharynx is clear and moist.  TMs and canals are clear.   Eyes: Conjunctivae and EOM are normal. Pupils are equal, round, and reactive to light.  Neck: Neck supple. No thyromegaly present.  Cardiovascular: Normal rate, regular rhythm and normal heart sounds.   Pulmonary/Chest: Effort normal and breath sounds normal. She has no wheezes.  Lymphadenopathy:    She has no cervical adenopathy.  Neurological: She is alert and oriented to person, place, and time. She has normal reflexes. She displays normal reflexes. No cranial nerve deficit. She exhibits normal muscle tone. Coordination normal.  Negative Dix-Hallpike maneuver  Skin: Skin is warm and dry.  Psychiatric: She has a normal mood and affect.          Assessment & Plan:  Vertigo-unclear if benign positional vertigo versus vestibulitis. Her description is consistent with BPV but she has a negative Dix-Hallpike maneuver today. Thus I am going to treat her with exercises for BPV in addition to putting her on a prednisone burst. I would also like for her to add a Claritin since she does have a history of allergic rhinitis.  Left ear pressure-tympanometry is normal today  in both the ears. Warned about potential side  effects of prednisone. If she's not noticing significant improvement in the next 2 weeks or starts to develop any hearing loss and please call back.  Hyperlipidemia-due to repeat lipids. Lab slip provided today.

## 2013-10-03 LAB — LIPID PANEL
Cholesterol: 230 mg/dL — ABNORMAL HIGH (ref 0–200)
HDL: 75 mg/dL (ref >39)
LDL Cholesterol: 135 mg/dL — ABNORMAL HIGH (ref 0–99)
Total CHOL/HDL Ratio: 3.1 Ratio
Triglycerides: 102 mg/dL (ref <150)
VLDL: 20 mg/dL (ref 0–40)

## 2013-10-05 ENCOUNTER — Encounter: Payer: Self-pay | Admitting: Family Medicine

## 2013-10-05 ENCOUNTER — Ambulatory Visit (INDEPENDENT_AMBULATORY_CARE_PROVIDER_SITE_OTHER): Payer: BC Managed Care – PPO | Admitting: Family Medicine

## 2013-10-05 VITALS — BP 119/76 | HR 57 | Wt 114.0 lb

## 2013-10-05 DIAGNOSIS — H8110 Benign paroxysmal vertigo, unspecified ear: Secondary | ICD-10-CM

## 2013-10-05 DIAGNOSIS — H8113 Benign paroxysmal vertigo, bilateral: Secondary | ICD-10-CM

## 2013-10-05 DIAGNOSIS — E785 Hyperlipidemia, unspecified: Secondary | ICD-10-CM

## 2013-10-05 DIAGNOSIS — Z13820 Encounter for screening for osteoporosis: Secondary | ICD-10-CM

## 2013-10-05 NOTE — Progress Notes (Signed)
   Subjective:    Patient ID: Jordan Maynard, female    DOB: [birth year], 57 y.o.   MRN: [id number]  HPI Hyperlipidemia- wants to go over cholesterol results. [name] has changed her diet. She exercises regularly but this is not new.  Jordan HopesVertigo-she says it has him is completely resolved. Back she was able to run a race on Saturday and was able to turn her head without feeling dizzy or lightheaded. She still has a mild pressure sensation in her left ear. She actually thinks the prednisone as well for the most.  Review of Systems     Objective:   Physical Exam  Constitutional: She is oriented to person, place, and time. She appears well-developed and well-nourished.  HENT:  Head: Normocephalic and atraumatic.  Left Tm and canal is clear.   Eyes: Conjunctivae and EOM are normal.  Cardiovascular: Normal rate.   Pulmonary/Chest: Effort normal.  Neurological: She is alert and oriented to person, place, and time.  Skin: Skin is dry. No pallor.  Psychiatric: She has a normal mood and affect. Her behavior is normal.          Assessment & Plan:  Hyperlipidemia - 10 year risk is 1.9 %.  I reviewed this with her today. If he work on diet and exercise. He she can keep her cholesterol at this level can hold off on any cholesterol meds.   Vertigo-resolved. Recheck her left ear and it looks normal on exam today. If she exhibits any pain pressure or hearing loss then please contact me immediately and we'll refer her to ENT for further evaluation.

## 2013-10-20 ENCOUNTER — Ambulatory Visit (INDEPENDENT_AMBULATORY_CARE_PROVIDER_SITE_OTHER): Payer: BC Managed Care – PPO

## 2013-10-20 DIAGNOSIS — M949 Disorder of cartilage, unspecified: Secondary | ICD-10-CM

## 2013-10-20 DIAGNOSIS — Z13820 Encounter for screening for osteoporosis: Secondary | ICD-10-CM

## 2013-10-20 DIAGNOSIS — M899 Disorder of bone, unspecified: Secondary | ICD-10-CM

## 2013-11-30 ENCOUNTER — Other Ambulatory Visit: Payer: Self-pay | Admitting: Family Medicine

## 2013-12-25 ENCOUNTER — Other Ambulatory Visit: Payer: Self-pay | Admitting: Family Medicine

## 2014-01-07 ENCOUNTER — Emergency Department
Admission: EM | Admit: 2014-01-07 | Discharge: 2014-01-07 | Disposition: A | Discharge status: Home / Self Care | Payer: BC Managed Care – PPO | Source: Home / Self Care | Attending: Family Medicine | Admitting: Family Medicine

## 2014-01-07 ENCOUNTER — Encounter: Payer: Self-pay | Admitting: Emergency Medicine

## 2014-01-07 DIAGNOSIS — J029 Acute pharyngitis, unspecified: Secondary | ICD-10-CM

## 2014-01-07 LAB — POCT RAPID STREP A (OFFICE): Rapid Strep A Screen: NEGATIVE

## 2014-01-07 NOTE — ED Notes (Signed)
Sore throat x 2 days

## 2014-01-07 NOTE — ED Provider Notes (Signed)
CSN: [id number]     Arrival date & time 01/07/14  1306 History   First MD Initiated Contact with Patient 01/07/14 1309     Chief Complaint  Patient presents with  . Sore Throat      HPI Comments: Patient complains of onset of fatigue and sore throat yesterday.  No nasal congestion or cough.  No fevers, chills, and sweats.  She notes that for the past two weeks she has had vague discomfort in her throat, somewhat worse at night, but felt well otherwise.  No history of reflux.  The history is provided by the patient.    History reviewed. No pertinent past medical history. Past Surgical History  Procedure Laterality Date  . Septoplasty  May 2009   Family History  Problem Relation Age of Onset  . Skin cancer Father   . Hypertension Father   . Hypertension Mother    History  Substance Use Topics  . Smoking status: Former Smoker    Types: Cigarettes    Quit date: 02/26/2006  . Smokeless tobacco: Not on file  . Alcohol Use: 0.5 oz/week    1 drink(s) per week   OB History    No data available     Review of Systems + sore throat No cough No pleuritic pain No wheezing No nasal congestion ? post-nasal drainage No sinus pain/pressure No itchy/red eyes No earache No hemoptysis No SOB No fever/chills No nausea No vomiting No abdominal pain No diarrhea No urinary symptoms No skin rash + fatigue No myalgias No headache Used OTC meds without relief  Allergies  Review of patient's allergies indicates not on file.  Home Medications   Prior to Admission medications   Medication Sig Start Date End Date Taking? Authorizing Provider  fluticasone (FLONASE) 50 MCG/ACT nasal spray Place into both nostrils daily.   Yes Historical Provider, MD  guaiFENesin (MUCINEX) 600 MG 12 hr tablet Take by mouth 2 (two) times daily.   Yes Historical Provider, MD  acyclovir (ZOVIRAX) 400 MG tablet TAKE 1 TABLET THREE TIMES A DAY 12/25/13   Agapito Gamesatherine D Metheney, MD  ALPRAZolam Prudy Feeler(XANAX) 0.5  MG tablet Take 0.5-1 tablets (0.25-0.5 mg total) by mouth daily as needed for anxiety. 06/09/13   Agapito Gamesatherine D Metheney, MD  calcium-vitamin D (OSCAL WITH D) 500-200 MG-UNIT per tablet Take 1 tablet by mouth daily.    Historical Provider, MD  zolpidem (AMBIEN) 10 MG tablet TAKE 1 TABLET AT BEDTIME AS NEEDED SLEEP 11/30/13   Agapito Gamesatherine D Metheney, MD   BP 125/85 mmHg  Pulse 74  Temp(Src) 98.2 F (36.8 C) (Oral)  Ht 5\' 1"  (1.549 m)  Wt 114 lb (51.71 kg)  BMI 21.55 kg/m2  SpO2 99% Physical Exam Nursing notes and Vital Signs reviewed. Appearance:  Patient appears healthy, stated age, and in no acute distress Eyes:  Pupils are equal, round, and reactive to light and accomodation.  Extraocular movement is intact.  Conjunctivae are not inflamed  Ears:  Canals normal.  Tympanic membranes normal.  Nose:  Normal turbinates.  No sinus tenderness.   Mouth:  Normal Pharynx:   Minimal erythema of uvula and tonsillar pillars.  No exudate Neck:  Supple.  Slightly tender shotty left posterior node.  Lungs:  Clear to auscultation.  Breath sounds are equal.  Heart:  Regular rate and rhythm without murmurs, rubs, or gallops.  Abdomen:  Nontender without masses or hepatosplenomegaly.  Bowel sounds are present.  No CVA or flank tenderness.  Extremities:  No edema.  No calf tenderness Skin:  No rash present.   ED Course  Procedures  none    Labs Reviewed  STREP A DNA PROBE  POCT RAPID STREP A (OFFICE) Negative         MDM   1. Acute pharyngitis, unspecified pharyngitis type; suspect early viral URI     Throat culture pending May take Ibuprofen 200mg , 4 tabs every 8 hours with food for sore throat, fever, etc.  If cold-like symptoms develop, may begin the following: Take plain Mucinex (1200 mg guaifenesin) twice daily for cough and congestion.  May add Sudafed for sinus congestion.   Increase fluid intake, rest. May use Afrin nasal spray (or generic oxymetazoline) twice daily for about 5 days.   Also recommend using saline nasal spray several times daily and saline nasal irrigation (AYR is a common brand).  May continue Flonase. Try warm salt water gargles for sore throat.  Stop all antihistamines for now, and other non-prescription cough/cold preparations. May take Delsym Cough Suppressant at bedtime for nighttime cough.   Followup with PCP if not improving in one week    Lattie HawStephen A Elener Custodio, MD 01/07/14 1356

## 2014-01-07 NOTE — Discharge Instructions (Signed)
May take Ibuprofen 200mg , 4 tabs every 8 hours with food for sore throat, fever, etc. If cold-like symptoms develop, may begin the following: Take plain Mucinex (1200 mg guaifenesin) twice daily for cough and congestion.  May add Sudafed for sinus congestion.   Increase fluid intake, rest. May use Afrin nasal spray (or generic oxymetazoline) twice daily for about 5 days.  Also recommend using saline nasal spray several times daily and saline nasal irrigation (AYR is a common brand).  May continue Flonase. Try warm salt water gargles for sore throat.  Stop all antihistamines for now, and other non-prescription cough/cold preparations. May take Delsym Cough Suppressant at bedtime for nighttime cough.

## 2014-01-08 LAB — STREP A DNA PROBE: GASP: NEGATIVE

## 2014-02-20 ENCOUNTER — Encounter: Payer: Self-pay | Admitting: *Deleted

## 2014-02-20 ENCOUNTER — Emergency Department
Admission: EM | Admit: 2014-02-20 | Discharge: 2014-02-20 | Disposition: A | Discharge status: Home / Self Care | Payer: BC Managed Care – PPO | Source: Home / Self Care | Attending: Emergency Medicine | Admitting: Emergency Medicine

## 2014-02-20 DIAGNOSIS — J0100 Acute maxillary sinusitis, unspecified: Secondary | ICD-10-CM

## 2014-02-20 MED ORDER — CEFDINIR 300 MG PO CAPS: 300.0000 mg | ORAL_CAPSULE | Freq: Two times a day (BID) | ORAL | Status: DC

## 2014-02-20 NOTE — ED Provider Notes (Addendum)
CSN: [id number]     Arrival date & time 02/20/14  1223 History   First MD Initiated Contact with Patient 02/20/14 1409     Chief Complaint  Patient presents with  . Nasal Congestion  . Otalgia   (Consider location/radiation/quality/duration/timing/severity/associated sxs/prior Treatment) HPI SINUSITIS  Onset: 3-4 days Facial/sinus pressure with discolored nasal mucus.    Severity: moderate Tried OTC meds without significant relief.  Symptoms:  + Fever  + URI prodrome with nasal congestion + Minimal swollen neck glands + mild Sinus Headache + mild ear pressure  No Allergy symptoms No significant Sore Throat No eye symptoms     No significant Cough No chest pain No shortness of breath  No wheezing  No Abdominal Pain No Nausea No Vomiting No diarrhea  No Myalgias No focal neurologic symptoms No syncope No Rash  No Urinary symptoms          History reviewed. No pertinent past medical history. Past Surgical History  Procedure Laterality Date  . Septoplasty  May 2009   Family History  Problem Relation Age of Onset  . Skin cancer Father   . Hypertension Father   . Hypertension Mother    History  Substance Use Topics  . Smoking status: Former Smoker    Types: Cigarettes    Quit date: 02/26/2006  . Smokeless tobacco: Not on file  . Alcohol Use: 0.5 oz/week    1 drink(s) per week   OB History    No data available     Review of Systems  All other systems reviewed and are negative.   Allergies  Review of patient's allergies indicates no known allergies.  Home Medications   Prior to Admission medications   Medication Sig Start Date End Date Taking? Authorizing Provider  acyclovir (ZOVIRAX) 400 MG tablet TAKE 1 TABLET THREE TIMES A DAY 12/25/13   Agapito Gamesatherine D Metheney, MD  ALPRAZolam Prudy Feeler(XANAX) 0.5 MG tablet Take 0.5-1 tablets (0.25-0.5 mg total) by mouth daily as needed for anxiety. 06/09/13   Agapito Gamesatherine D Metheney, MD  calcium-vitamin D (OSCAL  WITH D) 500-200 MG-UNIT per tablet Take 1 tablet by mouth daily.    Historical Provider, MD  cefdinir (OMNICEF) 300 MG capsule Take 1 capsule (300 mg total) by mouth 2 (two) times daily. X 10 days 02/20/14   Lajean Manesavid Massey, MD  fluticasone [facility](FLONASE) 50 MCG/ACT nasal spray Place into both nostrils daily.    Historical Provider, MD  guaiFENesin (MUCINEX) 600 MG 12 hr tablet Take by mouth 2 (two) times daily.    Historical Provider, MD  zolpidem (AMBIEN) 10 MG tablet TAKE 1 TABLET AT BEDTIME AS NEEDED SLEEP 11/30/13   Agapito Gamesatherine D Metheney, MD   BP 122/83 mmHg  Pulse 88  Temp(Src) 98.1 F (36.7 C) (Oral)  Ht 5\' 1"  (1.549 m)  Wt 115 lb (52.164 kg)  BMI 21.74 kg/m2  SpO2 98% Physical Exam  Constitutional: She is oriented to person, place, and time. She appears well-developed and well-nourished. No distress.  HENT:  Head: Normocephalic and atraumatic.  Right Ear: Tympanic membrane, external ear and ear canal normal.  Left Ear: Tympanic membrane, external ear and ear canal normal.  Nose: Mucosal edema and rhinorrhea present. Right sinus exhibits maxillary sinus tenderness. Left sinus exhibits maxillary sinus tenderness.  Mouth/Throat: Oropharynx is clear and moist. No oral lesions. No oropharyngeal exudate.  Eyes: Right eye exhibits no discharge. Left eye exhibits no discharge. No scleral icterus.  Neck: Neck supple.  Cardiovascular: Normal rate, regular rhythm and  normal heart sounds.   Pulmonary/Chest: Effort normal and breath sounds normal. She has no wheezes. She has no rales.  Lymphadenopathy:    She has no cervical adenopathy.  Neurological: She is alert and oriented to person, place, and time.  Skin: Skin is warm and dry.  Nursing note and vitals reviewed.   ED Course  Procedures (including critical care time) Labs Review Labs Reviewed - No data to display  Imaging Review No results found.   MDM   1. Acute maxillary sinusitis, recurrence not specified    Treatment options  discussed, as well as risks, benefits, alternatives. Patient voiced understanding and agreement with the following plans: Discharge Medication List as of 02/20/2014  2:19 PM    START taking these medications   Details  cefdinir (OMNICEF) 300 MG capsule Take 1 capsule (300 mg total) by mouth 2 (two) times daily. X 10 days, Starting 02/20/2014, Until Discontinued, Normal       OTC Mucinex D and Flonase.--Other symptomatic care discussed Follow-up with your primary care doctor in 5-7 days if not improving, or sooner if symptoms become worse. Precautions discussed. Red flags discussed. Questions invited and answered. Patient voiced understanding and agreement.     Lajean Manesavid Massey, MD 02/20/14 [id number]  Lajean Manesavid Massey, MD 02/20/14 2133

## 2014-02-20 NOTE — ED Notes (Signed)
Pt has had chills, bilateral ear pain, hoarse voice, nasal congestion with yellow and red drainage, eye pressure all starting 3 days ago.

## 2014-02-22 ENCOUNTER — Encounter: Payer: Self-pay | Admitting: Family Medicine

## 2014-02-22 ENCOUNTER — Ambulatory Visit (INDEPENDENT_AMBULATORY_CARE_PROVIDER_SITE_OTHER): Payer: BC Managed Care – PPO | Admitting: Family Medicine

## 2014-02-22 VITALS — BP 139/87 | HR 74 | Temp 98.1°F | Wt 115.0 lb

## 2014-02-22 DIAGNOSIS — J0100 Acute maxillary sinusitis, unspecified: Secondary | ICD-10-CM | POA: Diagnosis not present

## 2014-02-22 DIAGNOSIS — H6982 Other specified disorders of Eustachian tube, left ear: Secondary | ICD-10-CM | POA: Diagnosis not present

## 2014-02-22 MED ORDER — PREDNISONE 20 MG PO TABS: ORAL_TABLET | ORAL | Status: AC

## 2014-02-22 MED ORDER — CETIRIZINE HCL 10 MG PO TABS: 10.0000 mg | ORAL_TABLET | Freq: Every day | ORAL | Status: DC

## 2014-02-22 NOTE — Progress Notes (Signed)
CC: Jordan SchleinDana Maynard is a 58 y.o. female is here for Ear Pain   Subjective: HPI:  Complains of left ear pain that has been present for the past 4-5 days. it was accompanied by facial pressure in the cheeks, nasal congestion, postnasal drip and cough all of which has significantly improved since starting on Omnicef 2 days ago. Pain in the left ear has not changed at all. Nothing particularly makes it better or worse. It is described as a sharp spasm sensation that comes without warning multiple times throughout the day severe in severity. She denies hearing loss or ringing in the ears or any drainage from the ear. Other than above no other interventions as of yet. Denies fevers, chills, rashes or motor or sensory disturbances other than that described above.  She complains that she's had mild nasal congestion and stuffiness for the past months despite no change in her living environment, hobbies, diet or any other component of her lifestyle. She was noted there's something she can take to help prevent this.   Review Of Systems Outlined In HPI  No past medical history on file.  Past Surgical History  Procedure Laterality Date  . Septoplasty  May 2009   Family History  Problem Relation Age of Onset  . Skin cancer Father   . Hypertension Father   . Hypertension Mother     History   Social History  . Marital Status: Married    Spouse Name: Jillyn HiddenGary     Number of Children: 0  . Years of Education: N/A   Occupational History  . Painter/artist    .     Social History Main Topics  . Smoking status: Former Smoker    Types: Cigarettes    Quit date: 02/26/2006  . Smokeless tobacco: Not on file  . Alcohol Use: 0.5 oz/week    1 drink(s) per week  . Drug Use: No  . Sexual Activity:    Partners: Male   Other Topics Concern  . 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 She exercises regularly 3-5 days per  week. . 2 caffeinated drinks per day. No children.     Objective: BP 139/87 mmHg  Pulse 74  Temp(Src) 98.1 F (36.7 C) (Oral)  Wt 115 lb (52.164 kg)  General: Alert and Oriented, No Acute Distress HEENT: Pupils equal, round, reactive to light. Conjunctivae clear.  External ears unremarkable, canals clear with intact TMs with appropriate landmarks.  Middle ear appears open without effusion. Pink inferior turbinates.  Moist mucous membranes, pharynx without inflammation nor lesions.  Neck supple without palpable lymphadenopathy nor abnormal masses. Lungs: clear and comfortable work of breathing Cardiac: Regular rate and rhythm.  Mental Status: No depression, anxiety, nor agitation. Skin: Warm and dry.  Assessment & Plan: Jordan HarmanDana was seen today for ear pain.  Diagnoses and associated orders for this visit:  Subacute maxillary sinusitis - cetirizine (ZYRTEC ALLERGY) 10 MG tablet; Take 1 tablet (10 mg total) by mouth daily. - predniSONE (DELTASONE) 20 MG tablet; Three tabs daily days 1-3, two tabs daily days 4-6, one tab daily days 7-9, half tab daily days 10-13.  Eustachian tube dysfunction, left    Tympanogram on the right is normal, tympanogram on the left reveals positive pressure in the middle ear. Discussed continuing Omnicef for sinus infection and to begin with prednisone taper to help relieve the eustachian tube dysfunction complicated by  sinusitis and irritation of the nasal mucosa. Begin Zyrtec to see if this helps reduce and prevent nasal congestion after completing prednisone. Time was taken to answer all of her questions regarding eustachian tube dysfunction and sinusitis.  25 minutes spent face-to-face during visit today of which at least 50% was counseling or coordinating care regarding: 1. Subacute maxillary sinusitis   2. Eustachian tube dysfunction, left       Return if symptoms worsen or fail to improve.

## 2014-03-09 ENCOUNTER — Other Ambulatory Visit: Payer: Self-pay

## 2014-03-09 ENCOUNTER — Encounter: Payer: Self-pay | Admitting: Family Medicine

## 2014-03-09 MED ORDER — ZOLPIDEM TARTRATE 10 MG PO TABS: 10.0000 mg | ORAL_TABLET | Freq: Every day | ORAL | Status: DC

## 2014-05-24 ENCOUNTER — Other Ambulatory Visit: Payer: Self-pay | Admitting: Family Medicine

## 2014-07-27 ENCOUNTER — Other Ambulatory Visit: Payer: Self-pay | Admitting: Family Medicine

## 2014-09-20 ENCOUNTER — Other Ambulatory Visit: Payer: Self-pay | Admitting: Family Medicine

## 2014-09-28 ENCOUNTER — Encounter: Payer: Self-pay | Admitting: Family Medicine

## 2014-10-04 ENCOUNTER — Other Ambulatory Visit: Payer: Self-pay | Admitting: *Deleted

## 2014-10-04 DIAGNOSIS — Z0000 Encounter for general adult medical examination without abnormal findings: Secondary | ICD-10-CM

## 2014-10-04 DIAGNOSIS — Z114 Encounter for screening for human immunodeficiency virus [HIV]: Secondary | ICD-10-CM

## 2014-10-04 LAB — LIPID PANEL
Cholesterol: 272 mg/dL — ABNORMAL HIGH (ref 125–200)
HDL: 72 mg/dL (ref >=46)
LDL Cholesterol: 168 mg/dL — ABNORMAL HIGH (ref <130)
Total CHOL/HDL Ratio: 3.8 Ratio (ref <=5.0)
Triglycerides: 159 mg/dL — ABNORMAL HIGH (ref <150)
VLDL: 32 mg/dL — ABNORMAL HIGH (ref <30)

## 2014-10-04 LAB — COMPLETE METABOLIC PANEL WITH GFR
ALT: 13 U/L (ref 6–29)
AST: 15 U/L (ref 10–35)
Albumin: 4.3 g/dL (ref 3.6–5.1)
Alkaline Phosphatase: 65 U/L (ref 33–130)
BUN: 17 mg/dL (ref 7–25)
CO2: 30 mmol/L (ref 20–31)
Calcium: 9.2 mg/dL (ref 8.6–10.4)
Chloride: 104 mmol/L (ref 98–110)
Creat: 0.81 mg/dL (ref 0.50–1.05)
GFR, Est African American: >89 mL/min (ref >=60)
GFR, Est Non African American: 80 mL/min (ref >=60)
Glucose, Bld: 93 mg/dL (ref 65–99)
Potassium: 4.4 mmol/L (ref 3.5–5.3)
Sodium: 139 mmol/L (ref 135–146)
Total Bilirubin: 0.6 mg/dL (ref 0.2–1.2)
Total Protein: 6.8 g/dL (ref 6.1–8.1)

## 2014-10-05 ENCOUNTER — Other Ambulatory Visit: Payer: Self-pay | Admitting: Family Medicine

## 2014-10-05 ENCOUNTER — Encounter: Payer: Self-pay | Admitting: Family Medicine

## 2014-10-05 ENCOUNTER — Ambulatory Visit (INDEPENDENT_AMBULATORY_CARE_PROVIDER_SITE_OTHER): Payer: BLUE CROSS/BLUE SHIELD | Admitting: Family Medicine

## 2014-10-05 VITALS — BP 143/83 | HR 59 | Ht 61.0 in | Wt 116.0 lb

## 2014-10-05 DIAGNOSIS — G4700 Insomnia, unspecified: Secondary | ICD-10-CM | POA: Diagnosis not present

## 2014-10-05 DIAGNOSIS — Z1231 Encounter for screening mammogram for malignant neoplasm of breast: Secondary | ICD-10-CM

## 2014-10-05 DIAGNOSIS — Z0189 Encounter for other specified special examinations: Secondary | ICD-10-CM

## 2014-10-05 DIAGNOSIS — E785 Hyperlipidemia, unspecified: Secondary | ICD-10-CM

## 2014-10-05 DIAGNOSIS — Z0000 Encounter for general adult medical examination without abnormal findings: Secondary | ICD-10-CM

## 2014-10-05 LAB — HIV ANTIBODY (ROUTINE TESTING W REFLEX): HIV 1&2 Ab, 4th Generation: NONREACTIVE

## 2014-10-05 LAB — VITAMIN D 25 HYDROXY (VIT D DEFICIENCY, FRACTURES): Vit D, 25-Hydroxy: 30 ng/mL (ref 30–100)

## 2014-10-05 MED ORDER — ZOLPIDEM TARTRATE ER 6.25 MG PO TBCR: 6.2500 mg | EXTENDED_RELEASE_TABLET | Freq: Every evening | ORAL | Status: DC | PRN

## 2014-10-05 NOTE — Patient Instructions (Signed)
Keep up a regular exercise program and make sure you are eating a healthy diet Try to eat 4 servings of dairy a day, or if you are lactose intolerant take a calcium with vitamin D daily.  Your vaccines are up to date.   

## 2014-10-05 NOTE — Progress Notes (Signed)
Subjective:     Jordan Maynard is a 60 y.o. female and is here for a comprehensive physical exam. The patient reports problems - not sleeping well lately. Has had to increase her Ambien to  daily. Says feels like has been wearing off early around 2Am.  Able to fall asleep but it is staying asleep.   History   Social History  . Marital Status: Married    Spouse Name: Jillyn Hidden   . Number of Children: 0  . Years of Education: N/A   Occupational History  . Painter/artist    .     Social History Main Topics  . Smoking status: Former Smoker    Types: Cigarettes    Quit date: 02/26/2006  . Smokeless tobacco: Not on file  . Alcohol Use: 0.5 oz/week    1 drink(s) per week  . Drug Use: No  . Sexual Activity:    Partners: Male   Other Topics Concern  . 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 She exercises regularly 3-5 days per week. . 2 caffeinated drinks per day. No children.   Health Maintenance  Topic Date Due  . Hepatitis C Screening  September 14, 1955  . INFLUENZA VACCINE  09/27/2014  . MAMMOGRAM  01/14/2015  . PAP SMEAR  07/01/2015  . TETANUS/TDAP  05/26/2019  . COLONOSCOPY  08/06/2023  . HIV Screening  Completed    The following portions of the patient's history were reviewed and updated as appropriate: allergies, current medications, past family history, past medical history, past social history, past surgical history and problem list.  Review of Systems A comprehensive review of systems was negative.   Objective:    BP 143/83 mmHg  Pulse 59  Ht  (1.549 m)  Wt 116 lb (52.617 kg)  BMI 21.93 kg/m2 General appearance: alert, cooperative and appears stated age Head: Normocephalic, without obvious abnormality, atraumatic Eyes: conkj clear, EOMI PEERLA Ears: normal TM's and external ear canals both ears Nose: Nares normal. Septum midline. Mucosa normal. No drainage or sinus  tenderness. Throat: lips, mucosa, and tongue normal; teeth and gums normal Neck: no adenopathy, no carotid bruit, no JVD, supple, symmetrical, trachea midline and thyroid not enlarged, symmetric, no tenderness/mass/nodules Back: symmetric, no curvature. ROM normal. No CVA tenderness. Lungs: clear to auscultation bilaterally Breasts: normal appearance, no masses or tenderness Heart: regular rate and rhythm, S1, S2 normal, no murmur, click, rub or gallop Abdomen: soft, non-tender; bowel sounds normal; no masses,  no organomegaly Extremities: extremities normal, atraumatic, no cyanosis or edema Pulses: 2+ and symmetric Skin: Skin color, texture, turgor normal. No rashes or lesions Lymph nodes: Cervical, supraclavicular, and axillary nodes normal. Neurologic: Alert and oriented X 3, normal strength and tone. Normal symmetric reflexes. Normal coordination and gait    Assessment:    Healthy female exam.      Plan:     See After Visit Summary for Counseling Recommendations   Keep up a regular exercise program and make sure you are eating a healthy diet Try to eat 4 servings of dairy a day, or if you are lactose intolerant take a calcium with vitamin D daily.  Your vaccines are up to date.   Insomnia - discussed options.   Will try CR product.  F/U in 6 mo. Wanred about excess sedation.    Hyperlipidemia - work on diet and exercise. Consider recheck in 6-12 months.

## 2014-10-21 ENCOUNTER — Ambulatory Visit (INDEPENDENT_AMBULATORY_CARE_PROVIDER_SITE_OTHER): Payer: BLUE CROSS/BLUE SHIELD

## 2014-10-21 DIAGNOSIS — Z1231 Encounter for screening mammogram for malignant neoplasm of breast: Secondary | ICD-10-CM | POA: Diagnosis not present

## 2014-10-27 ENCOUNTER — Other Ambulatory Visit: Payer: Self-pay | Admitting: Family Medicine

## 2014-11-02 ENCOUNTER — Other Ambulatory Visit: Payer: Self-pay | Admitting: Family Medicine

## 2014-11-09 ENCOUNTER — Other Ambulatory Visit: Payer: Self-pay | Admitting: Family Medicine

## 2014-12-13 ENCOUNTER — Other Ambulatory Visit: Payer: Self-pay | Admitting: Family Medicine

## 2014-12-20 ENCOUNTER — Other Ambulatory Visit: Payer: Self-pay | Admitting: Family Medicine

## 2014-12-22 ENCOUNTER — Other Ambulatory Visit: Payer: Self-pay | Admitting: Family Medicine

## 2014-12-27 ENCOUNTER — Other Ambulatory Visit: Payer: Self-pay | Admitting: Family Medicine

## 2014-12-30 ENCOUNTER — Other Ambulatory Visit: Payer: Self-pay | Admitting: Family Medicine

## 2015-01-03 ENCOUNTER — Other Ambulatory Visit: Payer: Self-pay | Admitting: Family Medicine

## 2015-01-07 ENCOUNTER — Other Ambulatory Visit: Payer: Self-pay | Admitting: Family Medicine

## 2015-01-17 ENCOUNTER — Other Ambulatory Visit: Payer: Self-pay | Admitting: Family Medicine

## 2015-02-01 ENCOUNTER — Telehealth: Payer: Self-pay | Admitting: Family Medicine

## 2015-02-03 NOTE — Telephone Encounter (Signed)
error 

## 2015-02-15 ENCOUNTER — Other Ambulatory Visit: Payer: Self-pay | Admitting: Family Medicine

## 2015-03-08 ENCOUNTER — Other Ambulatory Visit: Payer: Self-pay | Admitting: Family Medicine

## 2015-03-15 ENCOUNTER — Other Ambulatory Visit: Payer: Self-pay | Admitting: Family Medicine

## 2015-03-21 ENCOUNTER — Other Ambulatory Visit: Payer: Self-pay | Admitting: Family Medicine

## 2015-03-24 ENCOUNTER — Other Ambulatory Visit: Payer: Self-pay | Admitting: Family Medicine

## 2015-03-30 ENCOUNTER — Other Ambulatory Visit: Payer: Self-pay | Admitting: Family Medicine

## 2015-03-30 ENCOUNTER — Telehealth: Payer: Self-pay

## 2015-03-30 NOTE — Telephone Encounter (Addendum)
Refill request came through from [facility] for Xanax. Called the patient and left a detailed message advising her that a follow up appointment was needed for further refills. Per Dr. Eppie Gibson last OV note she wanted the patient to follow up around 04/05/15 for follow up. Patient does not have appointment in the system as of now. Alveena Taira,CMA

## 2015-05-23 ENCOUNTER — Encounter: Payer: Self-pay | Admitting: Family Medicine

## 2015-05-23 ENCOUNTER — Ambulatory Visit (INDEPENDENT_AMBULATORY_CARE_PROVIDER_SITE_OTHER): Payer: BLUE CROSS/BLUE SHIELD | Admitting: Family Medicine

## 2015-05-23 VITALS — BP 129/70 | HR 67 | Wt 112.0 lb

## 2015-05-23 DIAGNOSIS — F419 Anxiety disorder, unspecified: Secondary | ICD-10-CM | POA: Insufficient documentation

## 2015-05-23 DIAGNOSIS — F411 Generalized anxiety disorder: Secondary | ICD-10-CM | POA: Insufficient documentation

## 2015-05-23 DIAGNOSIS — B009 Herpesviral infection, unspecified: Secondary | ICD-10-CM

## 2015-05-23 DIAGNOSIS — F40243 Fear of flying: Secondary | ICD-10-CM | POA: Insufficient documentation

## 2015-05-23 DIAGNOSIS — G4700 Insomnia, unspecified: Secondary | ICD-10-CM

## 2015-05-23 MED ORDER — ALPRAZOLAM 0.5 MG PO TABS: ORAL_TABLET | ORAL | Status: DC

## 2015-05-23 MED ORDER — ACYCLOVIR 400 MG PO TABS: 400.0000 mg | ORAL_TABLET | Freq: Three times a day (TID) | ORAL | Status: DC

## 2015-05-23 MED ORDER — ZOLPIDEM TARTRATE 10 MG PO TABS: 10.0000 mg | ORAL_TABLET | Freq: Every day | ORAL | Status: DC

## 2015-05-23 NOTE — Progress Notes (Signed)
   Subjective:    Patient ID: Jordan Maynard, female    DOB: 1955-08-11, 60 y.o.   MRN: [id number]  HPI 6 month f/U for insomnia - She is here to follow-up for sleep. She's actually been out of her Ambien for almost a month. She did not decided to try some over-the-counter melatonin and some herbs from her shot but says it really hasn't helped at all. She says for example last night she didn't even fall asleep until 3 AM after going to bed around 11 PM. And only got about 2 hours of sleep. With the Ambien she denies any excess sedation or sleepwalking etc.  Anxiety with flying - would like a refill on her xanax to use PRN.    Cold sores-she would like a refill on the acyclovir.   Review of Systems     Objective:   Physical Exam  Constitutional: She is oriented to person, place, and time. She appears well-developed and well-nourished.  HENT:  Head: Normocephalic and atraumatic.  Cardiovascular: Normal rate, regular rhythm and normal heart sounds.   Pulmonary/Chest: Effort normal and breath sounds normal.  Neurological: She is alert and oriented to person, place, and time.  Skin: Skin is warm and dry.  Psychiatric: She has a normal mood and affect. Her behavior is normal.          Assessment & Plan:  Insomnia - well controlled when on her Ambien. Prescription sent to the pharmacy. She will need follow-up in 6 months. Explained that because this is a scheduled drug has to be monitored a little bit more carefully and she will need to come in every 6 months to do so. Did warn about potential for habit forming potential of the medication. She has chronic insomnia. Next  Anxiety with flying-did refill her Xanax. She typically gets 6 tabs with 2 refills as needed.  Cold sores - refill acyclovir.

## 2015-05-24 ENCOUNTER — Encounter: Payer: Self-pay | Admitting: Family Medicine

## 2015-10-27 ENCOUNTER — Other Ambulatory Visit: Payer: Self-pay | Admitting: Family Medicine

## 2015-10-27 DIAGNOSIS — Z1231 Encounter for screening mammogram for malignant neoplasm of breast: Secondary | ICD-10-CM

## 2015-11-02 ENCOUNTER — Ambulatory Visit: Payer: BLUE CROSS/BLUE SHIELD

## 2015-11-09 ENCOUNTER — Ambulatory Visit (INDEPENDENT_AMBULATORY_CARE_PROVIDER_SITE_OTHER): Payer: BLUE CROSS/BLUE SHIELD

## 2015-11-09 DIAGNOSIS — Z1231 Encounter for screening mammogram for malignant neoplasm of breast: Secondary | ICD-10-CM | POA: Diagnosis not present

## 2015-11-21 DIAGNOSIS — H5213 Myopia, bilateral: Secondary | ICD-10-CM | POA: Insufficient documentation

## 2015-11-28 ENCOUNTER — Encounter: Payer: Self-pay | Admitting: Family Medicine

## 2015-11-28 DIAGNOSIS — Z1321 Encounter for screening for nutritional disorder: Secondary | ICD-10-CM

## 2015-11-28 DIAGNOSIS — Z0000 Encounter for general adult medical examination without abnormal findings: Secondary | ICD-10-CM

## 2015-11-28 DIAGNOSIS — E785 Hyperlipidemia, unspecified: Secondary | ICD-10-CM

## 2015-11-28 NOTE — Telephone Encounter (Signed)
Lab orders placed.  

## 2015-11-29 ENCOUNTER — Other Ambulatory Visit: Payer: Self-pay | Admitting: Family Medicine

## 2015-11-29 DIAGNOSIS — Z0000 Encounter for general adult medical examination without abnormal findings: Secondary | ICD-10-CM

## 2015-11-29 DIAGNOSIS — Z1321 Encounter for screening for nutritional disorder: Secondary | ICD-10-CM

## 2015-11-29 DIAGNOSIS — E785 Hyperlipidemia, unspecified: Secondary | ICD-10-CM

## 2015-11-29 LAB — CBC WITH DIFFERENTIAL/PLATELET
Basophils Absolute: 65 cells/uL (ref 0–200)
Basophils Relative: 1 %
Eosinophils Absolute: 65 cells/uL (ref 15–500)
Eosinophils Relative: 1 %
HCT: 40.1 % (ref 35.0–45.0)
Hemoglobin: 13.9 g/dL (ref 11.7–15.5)
Lymphocytes Relative: 31 %
Lymphs Abs: 2015 cells/uL (ref 850–3900)
MCH: 32.1 pg (ref 27.0–33.0)
MCHC: 34.7 g/dL (ref 32.0–36.0)
MCV: 92.6 fL (ref 80.0–100.0)
MPV: 9.9 fL (ref 7.5–12.5)
Monocytes Absolute: 780 cells/uL (ref 200–950)
Monocytes Relative: 12 %
Neutro Abs: 3575 cells/uL (ref 1500–7800)
Neutrophils Relative %: 55 %
Platelets: 234 10*3/uL (ref 140–400)
RBC: 4.33 MIL/uL (ref 3.80–5.10)
RDW: 12.7 % (ref 11.0–15.0)
WBC: 6.5 10*3/uL (ref 3.8–10.8)

## 2015-11-30 ENCOUNTER — Encounter: Payer: Self-pay | Admitting: Family Medicine

## 2015-11-30 ENCOUNTER — Ambulatory Visit (INDEPENDENT_AMBULATORY_CARE_PROVIDER_SITE_OTHER): Payer: BLUE CROSS/BLUE SHIELD

## 2015-11-30 ENCOUNTER — Other Ambulatory Visit (HOSPITAL_COMMUNITY)
Admission: RE | Admit: 2015-11-30 | Discharge: 2015-11-30 | Disposition: A | Discharge status: Home / Self Care | Payer: BLUE CROSS/BLUE SHIELD | Source: Ambulatory Visit | Attending: Family Medicine | Admitting: Family Medicine

## 2015-11-30 ENCOUNTER — Ambulatory Visit (INDEPENDENT_AMBULATORY_CARE_PROVIDER_SITE_OTHER): Payer: BLUE CROSS/BLUE SHIELD | Admitting: Family Medicine

## 2015-11-30 VITALS — BP 125/59 | HR 54 | Ht 61.0 in | Wt 111.0 lb

## 2015-11-30 DIAGNOSIS — M4802 Spinal stenosis, cervical region: Secondary | ICD-10-CM

## 2015-11-30 DIAGNOSIS — Z0000 Encounter for general adult medical examination without abnormal findings: Secondary | ICD-10-CM | POA: Diagnosis not present

## 2015-11-30 DIAGNOSIS — M5412 Radiculopathy, cervical region: Secondary | ICD-10-CM

## 2015-11-30 DIAGNOSIS — Z01419 Encounter for gynecological examination (general) (routine) without abnormal findings: Secondary | ICD-10-CM | POA: Insufficient documentation

## 2015-11-30 DIAGNOSIS — M542 Cervicalgia: Secondary | ICD-10-CM

## 2015-11-30 DIAGNOSIS — I7090 Unspecified atherosclerosis: Secondary | ICD-10-CM | POA: Diagnosis not present

## 2015-11-30 DIAGNOSIS — H9202 Otalgia, left ear: Secondary | ICD-10-CM | POA: Diagnosis not present

## 2015-11-30 DIAGNOSIS — Z1151 Encounter for screening for human papillomavirus (HPV): Secondary | ICD-10-CM | POA: Insufficient documentation

## 2015-11-30 LAB — VITAMIN D 25 HYDROXY (VIT D DEFICIENCY, FRACTURES): Vit D, 25-Hydroxy: 48 ng/mL (ref 30–100)

## 2015-11-30 LAB — COMPLETE METABOLIC PANEL WITH GFR
ALT: 11 U/L (ref 6–29)
AST: 17 U/L (ref 10–35)
Albumin: 4.4 g/dL (ref 3.6–5.1)
Alkaline Phosphatase: 57 U/L (ref 33–130)
BUN: 13 mg/dL (ref 7–25)
CO2: 29 mmol/L (ref 20–31)
Calcium: 9.3 mg/dL (ref 8.6–10.4)
Chloride: 102 mmol/L (ref 98–110)
Creat: 0.85 mg/dL (ref 0.50–1.05)
GFR, Est African American: 87 mL/min (ref >=60)
GFR, Est Non African American: 75 mL/min (ref >=60)
Glucose, Bld: 92 mg/dL (ref 65–99)
Potassium: 4 mmol/L (ref 3.5–5.3)
Sodium: 141 mmol/L (ref 135–146)
Total Bilirubin: 0.8 mg/dL (ref 0.2–1.2)
Total Protein: 6.8 g/dL (ref 6.1–8.1)

## 2015-11-30 LAB — LIPID PANEL
Cholesterol: 226 mg/dL — ABNORMAL HIGH (ref 125–200)
HDL: 80 mg/dL (ref >=46)
LDL Cholesterol: 129 mg/dL (ref <130)
Total CHOL/HDL Ratio: 2.8 Ratio (ref <=5.0)
Triglycerides: 85 mg/dL (ref <150)
VLDL: 17 mg/dL (ref <30)

## 2015-11-30 MED ORDER — ZOLPIDEM TARTRATE 10 MG PO TABS: 10.0000 mg | ORAL_TABLET | Freq: Every day | ORAL | 5 refills | Status: DC

## 2015-11-30 NOTE — Patient Instructions (Signed)
Keep up a regular exercise program and make sure you are eating a healthy diet Try to eat 4 servings of dairy a day, or if you are lactose intolerant take a calcium with vitamin D daily.  Your vaccines are up to date.   

## 2015-11-30 NOTE — Progress Notes (Signed)
Subjective:     Jordan Maynard is a 60 y.o. female and is here for a comprehensive physical exam. The patient reports problems - Left ear pain and pain at the base of her skull on the left.  he says it started several weeks ago. She doesn't report any injury or trauma. No recent upper respiratory infections. She describes it as a "soreness".  She says sometimes it feels better to press on the area. She's also noticed some intermittent numbness and tingling over her fingertips on her left hand. She thinks that all of them. She doesn't remember it being any specific fingers per se. He does have a history of degenerative disc disease in her lumbar spine. She's very active and plays tennis.  Social History   Social History  . Marital status: Married    Spouse name: Jillyn HiddenGary   . Number of children: 0  . Years of education: N/A   Occupational History  . Painter/artist    .  [employer]   Social History Main Topics  . Smoking status: Former Smoker    Types: Cigarettes    Quit date: 02/26/2006  . Smokeless tobacco: Not on file  . Alcohol use 0.5 oz/week    1 drink(s) per week  . Drug use: No  . Sexual activity: Yes    Partners: Male   Other Topics Concern  . 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 She exercises regularly 3-5 days per week. . 2 caffeinated drinks per day. No children.   Health Maintenance  Topic Date Due  . Hepatitis C Screening  23-Feb-1956  . PAP SMEAR  07/01/2015  . INFLUENZA VACCINE  02/26/2024 (Originally 09/27/2015)  . MAMMOGRAM  11/08/2017  . TETANUS/TDAP  05/26/2019  . COLONOSCOPY  08/06/2023  . HIV Screening  Completed    The following portions of the patient's history were reviewed and updated as appropriate: allergies, current medications, past family history, past medical history, past social history, past surgical history and problem list.  Review of Systems A  comprehensive review of systems was negative.   Objective:    BP (!) 125/59   Pulse (!) 54   Ht 5\' 1"  (1.549 m)   Wt 111 lb (50.3 kg)   SpO2 100%   BMI 20.97 kg/m  General appearance: alert, cooperative and appears stated age Head: Normocephalic, without obvious abnormality, atraumatic Eyes: conj clear, EOMI, PEERLA Ears: normal TM's and external ear canals both ears Nose: Nares normal. Septum midline. Mucosa normal. No drainage or sinus tenderness. Throat: lips, mucosa, and tongue normal; teeth and gums normal Neck: no adenopathy, no carotid bruit, no JVD, supple, symmetrical, trachea midline and thyroid not enlarged, symmetric, no tenderness/mass/nodules Back: symmetric, no curvature. ROM normal. No CVA tenderness. Lungs: clear to auscultation bilaterally Breasts: normal appearance, no masses or tenderness Heart: regular rate and rhythm, S1, S2 normal, no murmur, click, rub or gallop Abdomen: soft, non-tender; bowel sounds normal; no masses,  no organomegaly Pelvic: external genitalia normal, no adnexal masses or tenderness, no cervical motion tenderness, rectovaginal septum normal, uterus normal size, shape, and consistency, vagina normal without discharge and cervix is stenotic Extremities: extremities normal, atraumatic, no cyanosis or edema Pulses: 2+ and symmetric Skin: Skin color, texture, turgor normal. No rashes or lesions Lymph nodes: Cervical, supraclavicular, and axillary nodes normal. Neurologic: Alert and oriented X 3, normal strength and tone. Normal symmetric reflexes. Normal  coordination and gait   MSK: Normal flexion extension and rotation right and left of the cervical spine. Nontender over the cervical spine. She is little bit tender at the base of the skull near ear. No lymphadenopathy. An ear exam is normal. Range of motion of shoulders is normal as well as normal flexion extension of all fingers.   Assessment:    Healthy female exam.     Plan:     See  After Visit Summary for Counseling Recommendations   Keep up a regular exercise program and make sure you are eating a healthy diet Try to eat 4 servings of dairy a day, or if you are lactose intolerant take a calcium with vitamin D daily.  Your vaccines are up to date.  Labs up to date.   Left neck pain - her ear exam is completely normal so I really feel like her pain is originating from her neck especially with having some recent onset of some intermittent numbness and tingling in the left hand. Because she does have some radicular symptoms we'll go ahead and move forward with cervical x-ray to evaluate further. She is very physically active. I did give her handout for cervical strain as well.

## 2015-12-01 ENCOUNTER — Other Ambulatory Visit: Payer: Self-pay | Admitting: Family Medicine

## 2015-12-01 DIAGNOSIS — I6522 Occlusion and stenosis of left carotid artery: Secondary | ICD-10-CM

## 2015-12-01 LAB — CYTOLOGY - PAP

## 2015-12-01 NOTE — Addendum Note (Signed)
Addended by: Nani GasserMETHENEY, Analeya Luallen D on: 12/01/2015 05:38 PM   Modules accepted: Orders

## 2015-12-01 NOTE — Progress Notes (Signed)
Carotid calcification.

## 2015-12-06 ENCOUNTER — Ambulatory Visit (HOSPITAL_COMMUNITY)
Admission: RE | Admit: 2015-12-06 | Discharge: 2015-12-06 | Disposition: A | Discharge status: Home / Self Care | Payer: BLUE CROSS/BLUE SHIELD | Source: Ambulatory Visit | Attending: Family Medicine | Admitting: Family Medicine

## 2015-12-06 DIAGNOSIS — I6523 Occlusion and stenosis of bilateral carotid arteries: Secondary | ICD-10-CM | POA: Diagnosis not present

## 2015-12-06 DIAGNOSIS — I6522 Occlusion and stenosis of left carotid artery: Secondary | ICD-10-CM

## 2015-12-09 ENCOUNTER — Other Ambulatory Visit: Payer: Self-pay | Admitting: *Deleted

## 2015-12-09 DIAGNOSIS — Z79899 Other long term (current) drug therapy: Secondary | ICD-10-CM

## 2015-12-12 ENCOUNTER — Other Ambulatory Visit: Payer: Self-pay | Admitting: Family Medicine

## 2015-12-12 ENCOUNTER — Ambulatory Visit (INDEPENDENT_AMBULATORY_CARE_PROVIDER_SITE_OTHER): Payer: BLUE CROSS/BLUE SHIELD | Admitting: Physical Therapy

## 2015-12-12 ENCOUNTER — Encounter: Payer: Self-pay | Admitting: Family Medicine

## 2015-12-12 ENCOUNTER — Encounter: Payer: Self-pay | Admitting: Physical Therapy

## 2015-12-12 DIAGNOSIS — M436 Torticollis: Secondary | ICD-10-CM

## 2015-12-12 DIAGNOSIS — M542 Cervicalgia: Secondary | ICD-10-CM | POA: Diagnosis not present

## 2015-12-12 DIAGNOSIS — M6281 Muscle weakness (generalized): Secondary | ICD-10-CM | POA: Diagnosis not present

## 2015-12-12 MED ORDER — ATORVASTATIN CALCIUM 40 MG PO TABS: 40.0000 mg | ORAL_TABLET | Freq: Every day | ORAL | 3 refills | Status: DC

## 2015-12-12 NOTE — Therapy (Signed)
[facility] [address] [place], [place], [address] Phone: [phone]   Fax:  [phone]  Physical Therapy Evaluation  Patient Details  Name: Jordan SchleinDana Maynard MRN: [id number] Date of Birth: 11-Jan-1956 Referring Provider: Dr Linford ArnoldMetheney  Encounter Date: 12/12/2015      PT End of Session - 12/12/15 1024    Visit Number 1   Number of Visits 8   Date for PT Re-Evaluation 01/09/16   PT Start Time 1024   PT Stop Time 1117   PT Time Calculation (min) 53 min      History reviewed. No pertinent past medical history.  Past Surgical History:  Procedure Laterality Date  . SEPTOPLASTY  May 2009    There were no vitals filed for this visit.       Subjective Assessment - 12/12/15 1024    Subjective Pt reports she is a runner and has had trouble looking behind her to the Lt and she thought maybe it was inner ear.  This started about a year ago. She saw an audiologist and everything was ok,  Then a month ago she delvelped some numbness into her Lt hand.     Pertinent History runs still, plays tennis, does some yoga, had US of caratid arteries - will be starting on a statin   Diagnostic tests x-ray showed potential disc issue,  narrowing of the spine, degenerative changes.    Patient Stated Goals make the issue in her head/neck to go away.     Currently in Pain? Yes   Pain Score 3    Pain Location Head   Pain Orientation Left   Pain Descriptors / Indicators Pressure  tickle in the Lt inner ear   Pain Type Chronic pain   Pain Radiating Towards tingling into the ulnar distribution of the Lt hand   Pain Onset More than a month ago   Pain Frequency Constant   Aggravating Factors  nothing   Pain Relieving Factors nothing she has tried.             [facility] PT Assessment - 12/12/15 0001      Assessment   Medical Diagnosis cervical pain    Referring Provider Dr Linford ArnoldMetheney   Onset Date/Surgical Date 12/12/14   Hand Dominance  Right   Next MD Visit not scheduled yet   Prior Therapy none     Precautions   Precautions None     Balance Screen   Has the patient fallen in the past 6 months No   Has the patient had a decrease in activity level because of a fear of falling?  No   Is the patient reluctant to leave their home because of a fear of falling?  No     Home Environment   Living Environment Private residence   Home Layout Two level  no trouble     Prior Function   Level of Independence Independent   Vocation Retired   Leisure exercise, Forensic scientistpaint pet portraits      Observation/Other Assessments   Focus on Therapeutic Outcomes (FOTO)  41% limited     Posture/Postural Control   Posture/Postural Control Postural limitations   Postural Limitations Rounded Shoulders;Forward head     ROM / Strength   AROM / PROM / Strength AROM;Strength     AROM   AROM Assessment Site Shoulder;Cervical   Right/Left Shoulder --  bilat WNL   Cervical Flexion 1 finger from chest   Cervical Extension 20   Cervical -  Right Side Bend 10, tight   Cervical - Left Side Bend 25   Cervical - Right Rotation 55   Cervical - Left Rotation 65     Strength   Overall Strength Comments Lt shoulder ER 4/5, triceps 4+/5     Palpation   Spinal mobility cervical and thoracic WNL CPA, UPA mobs.    Palpation comment bilat upper traps, levator, scalenes, periocciput tight.                    [facility] Adult PT Treatment/Exercise - 12/12/15 0001      Exercises   Exercises Neck     Neck Exercises: Supine   Neck Retraction 10 reps;3 secs  head presses   Shoulder Flexion Both;10 reps  red band overhead   Shoulder ABduction Both;10 reps  red band   Other Supine Exercise bilat ER, SASH x10 red band     Modalities   Modalities Electrical Stimulation;Moist Heat     Moist Heat Therapy   Number Minutes Moist Heat 15 Minutes   Moist Heat Location Cervical     Electrical Stimulation   Electrical Stimulation Location  cevical   Electrical Stimulation Action IFC    Electrical Stimulation Parameters to tolerance   Electrical Stimulation Goals Pain;Tone                PT Education - 12/12/15 1055    Education provided Yes   Education Details HEP , TDN   Person(s) Educated Patient   Methods Explanation;Demonstration;Handout   Comprehension Returned demonstration;Verbalized understanding             PT Long Term Goals - 12/12/15 1311      PT LONG TERM GOAL #1   Title I with advanced HEP  for postural re-ed ( 01/09/16)    Time 4   Period Weeks   Status New     PT LONG TERM GOAL #2   Title demo cervical ROM WFL without pain/tightness ( 01/09/16)    Time 4   Period Weeks   Status New     PT LONG TERM GOAL #3   Title report overall reduction of Lt ear/head issues =/> 75% ( 01/09/16)    Time 4   Period Weeks   Status New     PT LONG TERM GOAL #4   Title improve FOTO =/< 33% limited ( 01/09/16)    Time 4   Period Weeks   Status New     PT LONG TERM GOAL #5   Title demo Lt shoulder ER and tricep strength =/> 5-/5 ( 01/09/16)    Time 4   Period Weeks   Status New               Plan - 12/12/15 1307    Clinical Impression Statement Pt presents with Lt sided head/ear/neck pain with symptoms into her Lt hand. She has a forward head with rounded shoulders along with significant tightness in her cervical and perioccipital muscles. She is very active and has developed concern about not being able to turnher head as much.    Rehab Potential Good   PT Frequency 2x / week   PT Duration 4 weeks   PT Treatment/Interventions Moist Heat;Ultrasound;Traction;Therapeutic exercise;Dry needling;Taping;Manual techniques;Cryotherapy;Electrical Stimulation;Patient/family education   PT Next Visit Plan Pt would benefit from TDN however is not open to it at this time.  Manual therapy to SCM, scalenes, upper traps, periocciput. progress postural correction   Consulted and Agree with Plan  of  Care Patient      Patient will benefit from skilled therapeutic intervention in order to improve the following deficits and impairments:  Postural dysfunction, Decreased strength, Pain, Increased muscle spasms, Decreased range of motion  Visit Diagnosis: Stiffness of neck - Plan: PT plan of care cert/re-cert  Muscle weakness (generalized) - Plan: PT plan of care cert/re-cert  Pain, neck - Plan: PT plan of care cert/re-cert     Problem List Patient Active Problem List   Diagnosis Date Noted  . Routine general medical examination at a health care facility 11/30/2015  . 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 Lumbar degenerative disc disease 08/13/2012  . Osteopenia 07/03/2011  . DERMATITIS, ATOPIC 09/11/2010  . Herpes simplex virus (HSV) infection 02/14/2010  . Hyperlipidemia 05/27/2009  . INSOMNIA 05/25/2009    Roderic Scarce PT  12/12/2015, 1:16 PM  [facility] [address] [place], [place], [address] Phone: [phone]   Fax:  [phone]  Name: Jordan Maynard MRN: [id number] Date of Birth: 1956-02-25

## 2015-12-12 NOTE — Therapy (Signed)
[facility] [address] [place], [place], [address] Phone: [phone]   Fax:  [phone]  Physical Therapy Evaluation  Patient Details  Name: Jordan SchleinDana Maynard MRN: [id number] Date of Birth: 1955/10/27 Referring Provider: Dr Linford ArnoldMetheney  Encounter Date: 12/12/2015      PT End of Session - 12/12/15 1024    Visit Number 1   Number of Visits 8   Date for PT Re-Evaluation 01/09/16   PT Start Time 1024   PT Stop Time 1117   PT Time Calculation (min) 53 min      History reviewed. No pertinent past medical history.  Past Surgical History:  Procedure Laterality Date  . SEPTOPLASTY  May 2009    There were no vitals filed for this visit.       Subjective Assessment - 12/12/15 1024    Subjective Pt reports she is a runner and has had trouble looking behind her to the Lt and she thought maybe it was inner ear.  This started about a year ago. She saw an audiologist and everything was ok,  Then a month ago she delvelped some numbness into her Lt hand.     Pertinent History runs still, plays tennis, does some yoga, had US of caratid arteries - will be starting on a statin   Diagnostic tests x-ray showed potential disc issue,  narrowing of the spine, degenerative changes.    Patient Stated Goals make the issue in her head/neck to go away.     Currently in Pain? Yes   Pain Score 3    Pain Location Head   Pain Orientation Left   Pain Descriptors / Indicators Pressure  tickle in the Lt inner ear   Pain Type Chronic pain   Pain Radiating Towards tingling into the ulnar distribution of the Lt hand   Pain Onset More than a month ago   Pain Frequency Constant   Aggravating Factors  nothing   Pain Relieving Factors nothing she has tried.             [facility] PT Assessment - 12/12/15 0001      Assessment   Medical Diagnosis cervical pain    Referring Provider Dr Linford ArnoldMetheney   Onset Date/Surgical Date 12/12/14   Hand Dominance  Right   Next MD Visit not scheduled yet   Prior Therapy none     Precautions   Precautions None     Balance Screen   Has the patient fallen in the past 6 months No   Has the patient had a decrease in activity level because of a fear of falling?  No   Is the patient reluctant to leave their home because of a fear of falling?  No     Home Environment   Living Environment Private residence   Home Layout Two level  no trouble     Prior Function   Level of Independence Independent   Vocation Retired   Leisure exercise, Forensic scientistpaint pet portraits      Observation/Other Assessments   Focus on Therapeutic Outcomes (FOTO)  41% limited     Posture/Postural Control   Posture/Postural Control Postural limitations   Postural Limitations Rounded Shoulders;Forward head     ROM / Strength   AROM / PROM / Strength AROM;Strength     AROM   AROM Assessment Site Shoulder;Cervical   Right/Left Shoulder --  bilat WNL   Cervical Flexion 1 finger from chest   Cervical Extension 20   Cervical -  Right Side Bend 10, tight   Cervical - Left Side Bend 25   Cervical - Right Rotation 55   Cervical - Left Rotation 65     Strength   Overall Strength Comments Lt shoulder ER 4/5, triceps 4+/5     Palpation   Spinal mobility cervical and thoracic WNL CPA, UPA mobs.    Palpation comment bilat upper traps, levator, scalenes, periocciput tight.                    [facility] Adult PT Treatment/Exercise - 12/12/15 0001      Exercises   Exercises Neck     Neck Exercises: Supine   Neck Retraction 10 reps;3 secs  head presses   Shoulder Flexion Both;10 reps  red band overhead   Shoulder ABduction Both;10 reps  red band   Other Supine Exercise bilat ER, SASH x10 red band     Modalities   Modalities Electrical Stimulation;Moist Heat     Moist Heat Therapy   Number Minutes Moist Heat 15 Minutes   Moist Heat Location Cervical     Electrical Stimulation   Electrical Stimulation Location  cevical   Electrical Stimulation Action IFC    Electrical Stimulation Parameters to tolerance   Electrical Stimulation Goals Pain;Tone                PT Education - 12/12/15 1055    Education provided Yes   Education Details HEP , TDN   Person(s) Educated Patient   Methods Explanation;Demonstration;Handout   Comprehension Returned demonstration;Verbalized understanding             PT Long Term Goals - 12/12/15 1311      PT LONG TERM GOAL #1   Title I with advanced HEP  for postural re-ed ( 01/09/16)    Time 4   Period Weeks   Status New     PT LONG TERM GOAL #2   Title demo cervical ROM WFL without pain/tightness ( 01/09/16)    Time 4   Period Weeks   Status New     PT LONG TERM GOAL #3   Title report overall reduction of Lt ear/head issues =/> 75% ( 01/09/16)    Time 4   Period Weeks   Status New     PT LONG TERM GOAL #4   Title improve FOTO =/< 33% limited ( 01/09/16)    Time 4   Period Weeks   Status New     PT LONG TERM GOAL #5   Title demo Lt shoulder ER and tricep strength =/> 5-/5 ( 01/09/16)    Time 4   Period Weeks   Status New               Plan - 12/12/15 1307    Clinical Impression Statement Pt presents with Lt sided head/ear/neck pain with symptoms into her Lt hand. She has a forward head with rounded shoulders along with significant tightness in her cervical and perioccipital muscles. She is very active and has developed concern about not being able to turnher head as much.    Rehab Potential Good   PT Frequency 2x / week   PT Duration 4 weeks   PT Treatment/Interventions Moist Heat;Ultrasound;Traction;Therapeutic exercise;Dry needling;Taping;Manual techniques;Cryotherapy;Electrical Stimulation;Patient/family education   PT Next Visit Plan Pt would benefit from TDN however is not open to it at this time.  Manual therapy to SCM, scalenes, upper traps, periocciput. progress postural correction   Consulted and Agree with Plan  of  Care Patient      Patient will benefit from skilled therapeutic intervention in order to improve the following deficits and impairments:  Postural dysfunction, Decreased strength, Pain, Increased muscle spasms, Decreased range of motion  Visit Diagnosis: Stiffness of neck - Plan: PT plan of care cert/re-cert  Muscle weakness (generalized) - Plan: PT plan of care cert/re-cert  Pain, neck - Plan: PT plan of care cert/re-cert     Problem List Patient Active Problem List   Diagnosis Date Noted  . Routine general medical examination at a health care facility 11/30/2015  . 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 Lumbar degenerative disc disease 08/13/2012  . Osteopenia 07/03/2011  . DERMATITIS, ATOPIC 09/11/2010  . Herpes simplex virus (HSV) infection 02/14/2010  . Hyperlipidemia 05/27/2009  . INSOMNIA 05/25/2009    Roderic Scarce PT  12/12/2015, 1:17 PM  [facility] [address] [place], [place], [address] Phone: [phone]   Fax:  [phone]  Name: Jordan Maynard MRN: [id number] Date of Birth: [birth year]

## 2015-12-12 NOTE — Progress Notes (Signed)
atorv 

## 2015-12-12 NOTE — Patient Instructions (Addendum)
Trigger Point Dry Needling  . What is Trigger Point Dry Needling (DN)? o DN is a physical therapy technique used to treat muscle pain and dysfunction. Specifically, DN helps deactivate muscle trigger points (muscle knots).  o A thin filiform needle is used to penetrate the skin and stimulate the underlying trigger point. The goal is for a local twitch response (LTR) to occur and for the trigger point to relax. No medication of any kind is injected during the procedure.   . What Does Trigger Point Dry Needling Feel Like?  o The procedure feels different for each individual patient. Some patients report that they do not actually feel the needle enter the skin and overall the process is not painful. Very mild bleeding may occur. However, many patients feel a deep cramping in the muscle in which the needle was inserted. This is the local twitch response.   Marland Kitchen. How Will I feel after the treatment? o Soreness is normal, and the onset of soreness may not occur for a few hours. Typically this soreness does not last longer than two days.  o Bruising is uncommon, however; ice can be used to decrease any possible bruising.  o In rare cases feeling tired or nauseous after the treatment is normal. In addition, your symptoms may get worse before they get better, this period will typically not last longer than 24 hours.   . What Can I do After My Treatment? o Increase your hydration by drinking more water for the next 24 hours. o You may place ice or heat on the areas treated that have become sore, however, do not use heat on inflamed or bruised areas. Heat often brings more relief post needling. o You can continue your regular activities, but vigorous activity is not recommended initially after the treatment for 24 hours. o DN is best combined with other physical therapy such as strengthening, stretching, and other therapies.    Head Press With Chin Tuck    Tuck chin SLIGHTLY toward chest, keep mouth  closed. Feel weight on back of head. Increase weight by pressing head down. Hold _3-5__ seconds. Relax. Repeat __10_ times. Surface: floor   Over Head Pull: Narrow Grip        On back, knees bent, feet flat, band across thighs, elbows straight but relaxed. Pull hands apart (start). Keeping elbows straight, bring arms up and over head, hands toward floor. Keep pull steady on band. Hold momentarily. Return slowly, keeping pull steady, back to start. Repeat _10-30__ times. Band color ___red___ . Once a day  Side Pull: Double Arm   On back, knees bent, feet flat. Arms perpendicular to body, shoulder level, elbows straight but relaxed. Pull arms out to sides, elbows straight. Resistance band comes across collarbones, hands toward floor. Hold momentarily. Slowly return to starting position. Repeat _10-30__ times. Band color __red___. Once a day    Sash   On back, knees bent, feet flat, left hand on left hip, right hand above left. Pull right arm DIAGONALLY (hip to shoulder) across chest. Bring right arm along head toward floor. Hold momentarily. Slowly return to starting position. Repeat _10-30__ times. Do with left arm. Band color __red____. Once a day.    Shoulder Rotation: Double Arm   On back, knees bent, feet flat, elbows tucked at sides, bent 90, hands palms up. Pull hands apart and down toward floor, keeping elbows near sides. Hold momentarily. Slowly return to starting position. Repeat __10-30_ times. Band color __red___.  Once a day.

## 2015-12-14 ENCOUNTER — Ambulatory Visit (INDEPENDENT_AMBULATORY_CARE_PROVIDER_SITE_OTHER): Payer: BLUE CROSS/BLUE SHIELD | Admitting: Physical Therapy

## 2015-12-14 DIAGNOSIS — M6281 Muscle weakness (generalized): Secondary | ICD-10-CM

## 2015-12-14 DIAGNOSIS — M436 Torticollis: Secondary | ICD-10-CM

## 2015-12-14 DIAGNOSIS — M542 Cervicalgia: Secondary | ICD-10-CM

## 2015-12-14 NOTE — Therapy (Signed)
[place], [place], [address] Phone: [phone]   Fax:  [phone]  Physical Therapy Treatment  Patient Details  Name: Jordan Maynard MRN: [id number] Date of Birth: 1955/03/10 Referring Provider: Dr. Madilyn Fireman  Encounter Date: 12/14/2015      PT End of Session - 12/14/15 1436    Visit Number 2   Number of Visits 8   Date for PT Re-Evaluation 01/09/16   PT Start Time 1401   PT Stop Time 2633   PT Time Calculation (min) 46 min   Activity Tolerance Patient tolerated treatment well;No increased pain   Behavior During Therapy [facility] for tasks assessed/performed      No past medical history on file.  Past Surgical History:  Procedure Laterality Date  . SEPTOPLASTY  May 2009    There were no vitals filed for this visit.      Subjective Assessment - 12/14/15 1459    Subjective Pt reports no new changes since last visit.  She performed HEP yesterday without any difficulty.     Currently in Pain? No/denies  just tightness in neck.             [facility] PT Assessment - 12/14/15 0001      Assessment   Medical Diagnosis cervical pain    Referring Provider Dr. Madilyn Fireman   Onset Date/Surgical Date 12/12/14   Hand Dominance Right   Next MD Visit not scheduled yet   Prior Therapy none     AROM   Cervical - Right Rotation 70  supine, after manual therapy   Cervical - Left Rotation 75  supine, after manual therapy          [facility] Adult PT Treatment/Exercise - 12/14/15 0001      Self-Care   Self-Care Other Self-Care Comments   Other Self-Care Comments  pt educated on self massage (MFR and ball TPR     Moist Heat Therapy   Number Minutes Moist Heat 15 Minutes   Moist Heat Location Cervical     Electrical Stimulation   Electrical Stimulation Location Rt upper trap/SCM/ cervical paraspinals.    Electrical Stimulation Action IFC   Electrical Stimulation Parameters to tolerance    Electrical  Stimulation Goals Tone     Manual Therapy   Manual Therapy Passive ROM;Myofascial release;Scapular mobilization   Manual therapy comments pt in supported supine    Myofascial Release Lt scalene, upper trap, platysma, SCM, suboccipital release.     Scapular Mobilization Lt shoulder in all directions    Passive ROM cervical rotation (pin and stretch), lateral flexion.       Neck Exercises: Stretches   Upper Trapezius Stretch 3 reps;30 seconds   Levator Stretch 3 reps;30 seconds                     PT Long Term Goals - 12/12/15 1311      PT LONG TERM GOAL #1   Title I with advanced HEP  for postural re-ed ( 01/09/16)    Time 4   Period Weeks   Status New     PT LONG TERM GOAL #2   Title demo cervical ROM WFL without pain/tightness ( 01/09/16)    Time 4   Period Weeks   Status New     PT LONG TERM GOAL #3   Title report overall reduction of Lt ear/head issues =/> 75% ( 01/09/16)    Time 4   Period Weeks  Status New     PT LONG TERM GOAL #4   Title improve FOTO =/< 33% limited ( 01/09/16)    Time 4   Period Weeks   Status New     PT LONG TERM GOAL #5   Title demo Lt shoulder ER and tricep strength =/> 5-/5 ( 01/09/16)    Time 4   Period Weeks   Status New               Plan - 12/14/15 1714    Clinical Impression Statement Pt had good response to manual therapy to cervical and shoulder musculature; able to rotate head further after completion.  No goals met yet; only 2nd visit.    Rehab Potential Good   PT Frequency 2x / week   PT Duration 4 weeks   PT Treatment/Interventions Moist Heat;Ultrasound;Traction;Therapeutic exercise;Dry needling;Taping;Manual techniques;Cryotherapy;Electrical Stimulation;Patient/family education   PT Next Visit Plan Continue Manual therapy to SCM, scalenes, upper traps, periocciput. progress postural correction.   Consulted and Agree with Plan of Care Patient      Patient will benefit from skilled therapeutic  intervention in order to improve the following deficits and impairments:  Postural dysfunction, Decreased strength, Pain, Increased muscle spasms, Decreased range of motion  Visit Diagnosis: Stiffness of neck  Muscle weakness (generalized)  Pain, neck     Problem List Patient Active Problem List   Diagnosis Date Noted  . Routine general medical examination at a health care facility 11/30/2015  . 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 Lumbar degenerative disc disease 08/13/2012  . Osteopenia 07/03/2011  . DERMATITIS, ATOPIC 09/11/2010  . Herpes simplex virus (HSV) infection 02/14/2010  . Hyperlipidemia 05/27/2009  . INSOMNIA 05/25/2009   Jordan Maynard, PTA 12/14/15 5:18 PM  [place], [place], [address] Phone: [phone]   Fax:  [phone]  Name: Jordan Maynard MRN: [id number] Date of Birth: 01/24/1956

## 2015-12-21 ENCOUNTER — Ambulatory Visit (INDEPENDENT_AMBULATORY_CARE_PROVIDER_SITE_OTHER): Payer: BLUE CROSS/BLUE SHIELD | Admitting: Physical Therapy

## 2015-12-21 DIAGNOSIS — M436 Torticollis: Secondary | ICD-10-CM

## 2015-12-21 DIAGNOSIS — M542 Cervicalgia: Secondary | ICD-10-CM | POA: Diagnosis not present

## 2015-12-21 DIAGNOSIS — M6281 Muscle weakness (generalized): Secondary | ICD-10-CM

## 2015-12-21 NOTE — Therapy (Addendum)
[facility] [place] [address] [place], [place], [address] Phone: [phone]   Fax:  [phone]  Physical Therapy Treatment  Patient Details  Name: Jordan Maynard MRN: [id number] Date of Birth: 07/17/1955 Referring Provider: Dr. Linford Arnold  Encounter Date: 12/21/2015      PT End of Session - 12/21/15 1102    Visit Number 3   Number of Visits 8   Date for PT Re-Evaluation 01/09/16   PT Start Time 1015   PT Stop Time 1102   PT Time Calculation (min) 47 min   Activity Tolerance Patient tolerated treatment well;No increased pain   Behavior During Therapy [facility] for tasks assessed/performed      No past medical history on file.  Past Surgical History:  Procedure Laterality Date  . SEPTOPLASTY  May 2009    There were no vitals filed for this visit.      Subjective Assessment - 12/21/15 1020    Subjective Pt still feels tingling in Lt ear, but it is a little less than before. She is having less tingling in Lt ring and pinky finger now.    Currently in Pain? No/denies            [facility] PT Assessment - 12/21/15 0001      Assessment   Medical Diagnosis cervical pain    Referring Provider Dr. Linford Arnold   Onset Date/Surgical Date 12/12/14   Hand Dominance Right   Next MD Visit not scheduled yet     AROM   Cervical - Right Side Bend 35   Cervical - Left Side Bend 37         [facility] Adult PT Treatment/Exercise - 12/21/15 0001      Neck Exercises: Theraband   Rows 10 reps;Green   Shoulder External Rotation 10 reps;Green  supine, 2 sets   Horizontal ABduction 10 reps;Green  supine   Other Theraband Exercises overhead pull with green band x 10 reps,  Sash each arm x 10 reps x 2 sets green band.      Moist Heat Therapy   Number Minutes Moist Heat --  pt declined, will do at home if needed.    Moist Heat Location --     Manual Therapy   Manual therapy comments pt in supported supine    Myofascial Release Lt  scalene, upper trap, platysma, SCM, suboccipital release.    Fascial pull to Lt ear in multi directions with anchor of Lt SCM/ platysma    Passive ROM cervical rotation (pin and stretch), lateral flexion.       Neck Exercises: Stretches   Upper Trapezius Stretch 3 reps;30 seconds   Levator Stretch 3 reps;30 seconds   Other Neck Stretches 10 reps of ulnar nerve stretch in sitting.                  PT Education - 12/21/15 1227    Education provided Yes   Education Details HEP - added ulnar nerve glide, fascial pull to ear.  Issued green band.    Person(s) Educated Patient   Methods Explanation;Demonstration   Comprehension Verbalized understanding;Returned demonstration             PT Long Term Goals - 12/21/15 1059      PT LONG TERM GOAL #1   Title I with advanced HEP  for postural re-ed ( 01/09/16)    Time 4   Period Weeks   Status On-going     PT LONG TERM GOAL #2  Title demo cervical ROM WFL without pain/tightness ( 01/09/16)    Time 4   Period Weeks   Status On-going  improving      PT LONG TERM GOAL #3   Title report overall reduction of Lt ear/head issues =/> 75% ( 01/09/16)    Time 4   Period Weeks   Status --  70% reduction      PT LONG TERM GOAL #4   Title improve FOTO =/< 33% limited ( 01/09/16)    Time 4   Period Weeks   Status On-going     PT LONG TERM GOAL #5   Title demo Lt shoulder ER and tricep strength =/> 5-/5 ( 01/09/16)    Time 4   Period Weeks   Status On-going               Plan - 12/21/15 1103    Clinical Impression Statement Pt has had 70% reduction of head/ear issues since beginning therapy.  She demonstrated improved cervical lateral flexion today.  Notable reduction in tension in musculature of Lt cervical/ upper trap.Progressing well towards established goals.    Rehab Potential Good   PT Frequency 2x / week   PT Duration 4 weeks   PT Treatment/Interventions Moist Heat;Ultrasound;Traction;Therapeutic  exercise;Dry needling;Taping;Manual techniques;Cryotherapy;Electrical Stimulation;Patient/family education   PT Next Visit Plan Pt requests to hold therapy while she works on [employer]; has financial concerns with continuation of therapy.  Will call to schedule.    Consulted and Agree with Plan of Care Patient      Patient will benefit from skilled therapeutic intervention in order to improve the following deficits and impairments:  Postural dysfunction, Decreased strength, Pain, Increased muscle spasms, Decreased range of motion  Visit Diagnosis: Stiffness of neck  Muscle weakness (generalized)  Pain, neck     Problem List Patient Active Problem List   Diagnosis Date Noted  . Routine general medical examination at a health care facility 11/30/2015  . 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 Lumbar degenerative disc disease 08/13/2012  . Osteopenia 07/03/2011  . DERMATITIS, ATOPIC 09/11/2010  . Herpes simplex virus (HSV) infection 02/14/2010  . Hyperlipidemia 05/27/2009  . INSOMNIA 05/25/2009   Kerin Perna, PTA 12/21/15 12:29 PM  [place], [place], [address] Phone: [phone]   Fax:  [phone]  Name: Jordan Maynard MRN: [id number] Date of Birth: 1955-04-26  PHYSICAL THERAPY DISCHARGE SUMMARY  Visits from Start of Care: 3  Current functional level related to goals / functional outcomes: unknown   Remaining deficits: Patient continues with symptoms.  Was placed on hold for 2 weeks for follow up testing and has not returned for further treatment.    Education / Equipment: HEP  Plan: Patient agrees to discharge.  Patient goals were not met. Patient is being discharged due to the patient's request.  ?????    Jeral Pinch, PT 01/25/16 11:42 AM

## 2015-12-29 ENCOUNTER — Ambulatory Visit (INDEPENDENT_AMBULATORY_CARE_PROVIDER_SITE_OTHER): Payer: BLUE CROSS/BLUE SHIELD

## 2015-12-29 ENCOUNTER — Encounter: Payer: Self-pay | Admitting: Family Medicine

## 2015-12-29 ENCOUNTER — Ambulatory Visit (INDEPENDENT_AMBULATORY_CARE_PROVIDER_SITE_OTHER): Payer: BLUE CROSS/BLUE SHIELD | Admitting: Family Medicine

## 2015-12-29 VITALS — BP 118/67 | HR 80 | Wt 111.0 lb

## 2015-12-29 DIAGNOSIS — M542 Cervicalgia: Secondary | ICD-10-CM

## 2015-12-29 DIAGNOSIS — H9202 Otalgia, left ear: Secondary | ICD-10-CM

## 2015-12-29 NOTE — Progress Notes (Signed)
Subjective:    CC: left ear pressure  HPI:  Patient is here today to follow-up on left neck/ear pain. We had addressed this about 4 weeks ago at her physical. At the time I felt like it could actually be coming from her neck. She also been having some recent onset of numbness and tingling in the left hand. We did a cervical spine x-ray which did show some mild disc space narrowing at C4-5 and C5-6. There was also some facet hypertrophy with foraminal narrowing at C3-4, C4-5, and C5-6 bilaterally. Is also some calcification of the left carotid artery. He is nontender 3 physical therapy sessions and says even at home she's been doing the stretches using heat and it really has not helped. In fact over the last few days she actually feels like it's gotten a little worse. It is not severe enough to require intervention. She is also Artie seeing ENT for her left ear and had a negative evaluation and they did not think it was coming from her ear.  Past medical history, Surgical history, Family history not pertinant except as noted below, Social history, Allergies, and medications have been entered into the medical record, reviewed, and corrections made.   Review of Systems: No fevers, chills, night sweats, weight loss, chest pain, or shortness of breath.   Objective:    General: Well Developed, well nourished, and in no acute distress.  Neuro: Alert and oriented x3, extra-ocular muscles intact, sensation grossly intact.  HEENT: Normocephalic, atraumatic  Skin: Warm and dry, no rashes. Cardiac: Regular rate and rhythm, no murmurs rubs or gallops, no lower extremity edema.  Respiratory: Clear to auscultation bilaterally. Not using accessory muscles, speaking in full sentences. Muscle skeletal-normal cervical flexion, extension, rotation right and left side bending. She did have discomfort with rotation to the right on the left side of her neck. Nontender over the cervical spine itself.   Impression and  Recommendations:   Left ear/neck pain- still unclear etiology. When I last saw her I really felt like some of this was coming from her cervical spine especially after seeing the x-ray. She has done some physical therapy but it's not getting better and if anything is getting a little worse. At this point we'll move forward with CT of the ear. If negative then consider MRI of cervical spine for further evaluation.

## 2016-01-03 ENCOUNTER — Other Ambulatory Visit: Payer: Self-pay | Admitting: Family Medicine

## 2016-01-03 DIAGNOSIS — H9202 Otalgia, left ear: Secondary | ICD-10-CM

## 2016-01-03 DIAGNOSIS — M542 Cervicalgia: Secondary | ICD-10-CM

## 2016-01-05 ENCOUNTER — Other Ambulatory Visit: Payer: BLUE CROSS/BLUE SHIELD

## 2016-01-09 ENCOUNTER — Other Ambulatory Visit: Payer: BLUE CROSS/BLUE SHIELD

## 2016-01-16 ENCOUNTER — Ambulatory Visit (INDEPENDENT_AMBULATORY_CARE_PROVIDER_SITE_OTHER): Payer: BLUE CROSS/BLUE SHIELD

## 2016-01-16 DIAGNOSIS — M50323 Other cervical disc degeneration at C6-C7 level: Secondary | ICD-10-CM | POA: Diagnosis not present

## 2016-01-16 DIAGNOSIS — M4802 Spinal stenosis, cervical region: Secondary | ICD-10-CM

## 2016-01-16 DIAGNOSIS — M5020 Other cervical disc displacement, unspecified cervical region: Secondary | ICD-10-CM | POA: Diagnosis not present

## 2016-01-16 DIAGNOSIS — M542 Cervicalgia: Secondary | ICD-10-CM

## 2016-01-16 DIAGNOSIS — H9202 Otalgia, left ear: Secondary | ICD-10-CM

## 2016-02-23 ENCOUNTER — Encounter: Payer: Self-pay | Admitting: Family Medicine

## 2016-02-23 NOTE — Telephone Encounter (Signed)
Called and clarified with Pt on the phone. Will get blood work when she returns to town. No further questions.

## 2016-05-21 ENCOUNTER — Other Ambulatory Visit: Payer: Self-pay | Admitting: Family Medicine

## 2016-05-21 DIAGNOSIS — E785 Hyperlipidemia, unspecified: Secondary | ICD-10-CM

## 2016-05-21 LAB — LIPID PANEL W/REFLEX DIRECT LDL
Cholesterol: 161 mg/dL (ref <200)
HDL: 67 mg/dL (ref >50)
LDL-Cholesterol: 75 mg/dL
Non-HDL Cholesterol (Calc): 94 mg/dL (ref <130)
Total CHOL/HDL Ratio: 2.4 Ratio (ref <5.0)
Triglycerides: 102 mg/dL (ref <150)

## 2016-05-21 LAB — COMPLETE METABOLIC PANEL WITH GFR
ALT: 16 U/L (ref 6–29)
AST: 13 U/L (ref 10–35)
Albumin: 4 g/dL (ref 3.6–5.1)
Alkaline Phosphatase: 60 U/L (ref 33–130)
BUN: 12 mg/dL (ref 7–25)
CO2: 29 mmol/L (ref 20–31)
Calcium: 9.3 mg/dL (ref 8.6–10.4)
Chloride: 105 mmol/L (ref 98–110)
Creat: 0.72 mg/dL (ref 0.50–1.05)
GFR, Est African American: >89 mL/min (ref >=60)
GFR, Est Non African American: >89 mL/min (ref >=60)
Glucose, Bld: 95 mg/dL (ref 65–99)
Potassium: 4.2 mmol/L (ref 3.5–5.3)
Sodium: 142 mmol/L (ref 135–146)
Total Bilirubin: 0.8 mg/dL (ref 0.2–1.2)
Total Protein: 6.4 g/dL (ref 6.1–8.1)

## 2016-05-23 ENCOUNTER — Encounter: Payer: Self-pay | Admitting: Family Medicine

## 2016-05-23 ENCOUNTER — Ambulatory Visit (INDEPENDENT_AMBULATORY_CARE_PROVIDER_SITE_OTHER): Payer: BLUE CROSS/BLUE SHIELD | Admitting: Family Medicine

## 2016-05-23 VITALS — BP 128/69 | HR 57 | Ht 61.0 in | Wt 115.0 lb

## 2016-05-23 DIAGNOSIS — E785 Hyperlipidemia, unspecified: Secondary | ICD-10-CM

## 2016-05-23 DIAGNOSIS — F5101 Primary insomnia: Secondary | ICD-10-CM | POA: Diagnosis not present

## 2016-05-23 MED ORDER — ALPRAZOLAM 0.5 MG PO TABS: ORAL_TABLET | ORAL | 2 refills | Status: DC

## 2016-05-23 MED ORDER — ZOLPIDEM TARTRATE 10 MG PO TABS: 10.0000 mg | ORAL_TABLET | Freq: Every day | ORAL | 5 refills | Status: DC

## 2016-05-23 MED ORDER — ACYCLOVIR 400 MG PO TABS: 400.0000 mg | ORAL_TABLET | Freq: Three times a day (TID) | ORAL | 99 refills | Status: DC

## 2016-05-23 NOTE — Progress Notes (Signed)
Subjective:    CC: Hyperlipidemia-  HPI: 61 year old female comes in today to follow-up on starting a cholesterol medication. She started on atorvastatin 40 mg in October and has been on it for about 6 months. She's been tolerating it well without any side effects. He went for repeat labs on March 26 and her LDL was down to 75 triglycerides were at goal and HDL look fantastic at 67. She has been trying to eat healthy.    Insomnia - Doing well overall with her medication. She denies any side effects or excess sedation. She does need a refill for the next 6 months.   Past medical history, Surgical history, Family history not pertinant except as noted below, Social history, Allergies, and medications have been entered into the medical record, reviewed, and corrections made.   Review of Systems: No fevers, chills, night sweats, weight loss, chest pain, or shortness of breath.   Objective:    General: Well Developed, well nourished, and in no acute distress.  Neuro: Alert and oriented x3, extra-ocular muscles intact, sensation grossly intact.  HEENT: Normocephalic, atraumatic  Skin: Warm and dry, no rashes. Cardiac: Regular rate and rhythm, no murmurs rubs or gallops, no lower extremity edema.  Respiratory: Clear to auscultation bilaterally. Not using accessory muscles, speaking in full sentences.   Impression and Recommendations:    Hyperlipidemia-Looks great on the statin and with dietary changes that she's made recently. Continue current regimen. She would like to recheck it in 6 months and set of a year as she plans on getting back into her exercise routine. She stopped temple rarely because of some neck pain issues and says she is going to get back into it.  Insomnia - stable. On chronic Ambien. No side effects or concerns at this time. Refills sent for 6 months.

## 2016-11-13 ENCOUNTER — Encounter: Payer: Self-pay | Admitting: Family Medicine

## 2016-11-13 DIAGNOSIS — Z1239 Encounter for other screening for malignant neoplasm of breast: Secondary | ICD-10-CM

## 2016-11-13 DIAGNOSIS — E7849 Other hyperlipidemia: Secondary | ICD-10-CM

## 2016-11-13 DIAGNOSIS — M8580 Other specified disorders of bone density and structure, unspecified site: Secondary | ICD-10-CM

## 2016-12-03 ENCOUNTER — Other Ambulatory Visit: Payer: Self-pay | Admitting: Family Medicine

## 2016-12-05 ENCOUNTER — Ambulatory Visit (INDEPENDENT_AMBULATORY_CARE_PROVIDER_SITE_OTHER): Payer: BLUE CROSS/BLUE SHIELD

## 2016-12-05 DIAGNOSIS — M8588 Other specified disorders of bone density and structure, other site: Secondary | ICD-10-CM | POA: Diagnosis not present

## 2016-12-05 DIAGNOSIS — Z1231 Encounter for screening mammogram for malignant neoplasm of breast: Secondary | ICD-10-CM | POA: Diagnosis not present

## 2016-12-05 DIAGNOSIS — Z1239 Encounter for other screening for malignant neoplasm of breast: Secondary | ICD-10-CM

## 2016-12-05 DIAGNOSIS — M85852 Other specified disorders of bone density and structure, left thigh: Secondary | ICD-10-CM

## 2016-12-05 DIAGNOSIS — M8580 Other specified disorders of bone density and structure, unspecified site: Secondary | ICD-10-CM

## 2016-12-11 ENCOUNTER — Other Ambulatory Visit: Payer: Self-pay | Admitting: Family Medicine

## 2016-12-11 DIAGNOSIS — E785 Hyperlipidemia, unspecified: Secondary | ICD-10-CM

## 2016-12-11 DIAGNOSIS — Z0000 Encounter for general adult medical examination without abnormal findings: Secondary | ICD-10-CM

## 2016-12-11 LAB — COMPLETE METABOLIC PANEL WITH GFR
AG Ratio: 1.9 (calc) (ref 1.0–2.5)
ALT: 14 U/L (ref 6–29)
AST: 12 U/L (ref 10–35)
Albumin: 4.3 g/dL (ref 3.6–5.1)
Alkaline phosphatase (APISO): 75 U/L (ref 33–130)
BUN: 11 mg/dL (ref 7–25)
CO2: 31 mmol/L (ref 20–32)
Calcium: 9.2 mg/dL (ref 8.6–10.4)
Chloride: 102 mmol/L (ref 98–110)
Creat: 0.69 mg/dL (ref 0.50–0.99)
GFR, Est African American: 110 mL/min/{1.73_m2} (ref >=60)
GFR, Est Non African American: 95 mL/min/{1.73_m2} (ref >=60)
Globulin: 2.3 g/dL (calc) (ref 1.9–3.7)
Glucose, Bld: 87 mg/dL (ref 65–99)
Potassium: 4.3 mmol/L (ref 3.5–5.3)
Sodium: 139 mmol/L (ref 135–146)
Total Bilirubin: 0.7 mg/dL (ref 0.2–1.2)
Total Protein: 6.6 g/dL (ref 6.1–8.1)

## 2016-12-11 LAB — CBC WITH DIFFERENTIAL/PLATELET
Basophils Absolute: 28 cells/uL (ref 0–200)
Basophils Relative: 0.5 %
Eosinophils Absolute: 61 cells/uL (ref 15–500)
Eosinophils Relative: 1.1 %
HCT: 39.4 % (ref 35.0–45.0)
Hemoglobin: 13.6 g/dL (ref 11.7–15.5)
Lymphs Abs: 2162 cells/uL (ref 850–3900)
MCH: 31.4 pg (ref 27.0–33.0)
MCHC: 34.5 g/dL (ref 32.0–36.0)
MCV: 91 fL (ref 80.0–100.0)
MPV: 10 fL (ref 7.5–12.5)
Monocytes Relative: 10.1 %
Neutro Abs: 2695 cells/uL (ref 1500–7800)
Neutrophils Relative %: 49 %
Platelets: 233 10*3/uL (ref 140–400)
RBC: 4.33 10*6/uL (ref 3.80–5.10)
RDW: 12.4 % (ref 11.0–15.0)
Total Lymphocyte: 39.3 %
WBC mixed population: 556 cells/uL (ref 200–950)
WBC: 5.5 10*3/uL (ref 3.8–10.8)

## 2016-12-11 LAB — LIPID PANEL
Cholesterol: 201 mg/dL — ABNORMAL HIGH (ref <200)
HDL: 78 mg/dL (ref >50)
LDL Cholesterol (Calc): 96 mg/dL (calc)
Non-HDL Cholesterol (Calc): 123 mg/dL (calc) (ref <130)
Total CHOL/HDL Ratio: 2.6 (calc) (ref <5.0)
Triglycerides: 163 mg/dL — ABNORMAL HIGH (ref <150)

## 2016-12-11 NOTE — Progress Notes (Signed)
Annual exam labs ordered per Pt request.

## 2016-12-13 ENCOUNTER — Ambulatory Visit (INDEPENDENT_AMBULATORY_CARE_PROVIDER_SITE_OTHER): Payer: BLUE CROSS/BLUE SHIELD | Admitting: Family Medicine

## 2016-12-13 ENCOUNTER — Encounter: Payer: Self-pay | Admitting: Family Medicine

## 2016-12-13 ENCOUNTER — Encounter: Payer: BLUE CROSS/BLUE SHIELD | Admitting: Family Medicine

## 2016-12-13 VITALS — BP 110/62 | HR 92 | Ht 61.0 in | Wt 119.0 lb

## 2016-12-13 DIAGNOSIS — Z0000 Encounter for general adult medical examination without abnormal findings: Secondary | ICD-10-CM | POA: Diagnosis not present

## 2016-12-13 DIAGNOSIS — E7849 Other hyperlipidemia: Secondary | ICD-10-CM

## 2016-12-13 NOTE — Patient Instructions (Addendum)

## 2016-12-13 NOTE — Progress Notes (Signed)
Subjective:     Jordan Maynard is a 61 y.o. female and is here for a comprehensive physical exam. The patient reports no problems. still some exercise with running and tennis but not as much as she used to.    Social History   Social History  . Marital status: Married    Spouse name: Jillyn HiddenGary   . Number of children: 0  . Years of education: N/A   Occupational History  . Painter/artist    .  [employer]   Social History Main Topics  . Smoking status: Former Smoker    Types: Cigarettes    Quit date: 02/26/2006  . Smokeless tobacco: Never Used  . Alcohol use 0.5 oz/week    1 Standard drinks or equivalent per week  . Drug use: No  . Sexual activity: Yes    Partners: Male   Other Topics Concern  . 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 She exercises regularly 3-5 days per week. . 2 caffeinated drinks per day. No children.   Health Maintenance  Topic Date Due  . Hepatitis C Screening  [birth year]  . INFLUENZA VACCINE  12/13/2017 (Originally 09/26/2016)  . MAMMOGRAM  12/06/2018  . TETANUS/TDAP  05/26/2019  . PAP SMEAR  11/29/2020  . DEXA SCAN  12/05/2021  . COLONOSCOPY  08/06/2023  . HIV Screening  Completed    The following portions of the patient's history were reviewed and updated as appropriate: allergies, current medications, past family history, past medical history, past social history, past surgical history and problem list.  Review of Systems A comprehensive review of systems was negative.   Objective:    BP 110/62   Pulse 92   Ht 5\' 1"  (1.549 m)   Wt 119 lb (54 kg)   SpO2 98%   BMI 22.48 kg/m  General appearance: alert, cooperative and appears stated age Head: Normocephalic, without obvious abnormality, atraumatic Eyes: conj clear, EOMI, PEERLA Ears: normal TM's and external ear canals both ears Nose: Nares normal. Septum midline. Mucosa normal. No drainage or sinus  tenderness. Throat: lips, mucosa, and tongue normal; teeth and gums normal Neck: no adenopathy, no carotid bruit, no JVD, supple, symmetrical, trachea midline and thyroid not enlarged, symmetric, no tenderness/mass/nodules Back: symmetric, no curvature. ROM normal. No CVA tenderness. Lungs: clear to auscultation bilaterally Breasts: normal appearance, no masses or tenderness, not performed Heart: regular rate and rhythm, S1, S2 normal, no murmur, click, rub or gallop Abdomen: soft, non-tender; bowel sounds normal; no masses,  no organomegaly Pelvic: deferred Extremities: extremities normal, atraumatic, no cyanosis or edema Pulses: 2+ and symmetric Skin: Skin color, texture, turgor normal. No rashes or lesions Lymph nodes: Cervical adenopathy: nl and Supraclavicular adenopathy: nl Neurologic: Alert and oriented X 3, normal strength and tone. Normal symmetric reflexes. Normal coordination and gait    Assessment:    Healthy female exam.      Plan:     See After Visit Summary for Counseling Recommendations   Keep up a regular exercise program and make sure you are eating a healthy diet Try to eat 4 servings of dairy a day, or if you are lactose intolerant take a calcium with vitamin D daily.  Your vaccines are up to date.

## 2016-12-19 ENCOUNTER — Encounter: Payer: Self-pay | Admitting: Family Medicine

## 2016-12-19 DIAGNOSIS — F5101 Primary insomnia: Secondary | ICD-10-CM

## 2016-12-19 MED ORDER — ZOLPIDEM TARTRATE 10 MG PO TABS: 10.0000 mg | ORAL_TABLET | Freq: Every day | ORAL | 5 refills | Status: DC

## 2017-10-29 ENCOUNTER — Encounter: Payer: Self-pay | Admitting: Family Medicine

## 2017-11-21 ENCOUNTER — Emergency Department (INDEPENDENT_AMBULATORY_CARE_PROVIDER_SITE_OTHER)
Admission: EM | Admit: 2017-11-21 | Discharge: 2017-11-21 | Disposition: A | Discharge status: Home / Self Care | Payer: BLUE CROSS/BLUE SHIELD | Source: Home / Self Care | Attending: Family Medicine | Admitting: Family Medicine

## 2017-11-21 ENCOUNTER — Other Ambulatory Visit: Payer: Self-pay

## 2017-11-21 ENCOUNTER — Encounter: Payer: Self-pay | Admitting: Emergency Medicine

## 2017-11-21 DIAGNOSIS — B029 Zoster without complications: Secondary | ICD-10-CM | POA: Diagnosis not present

## 2017-11-21 MED ORDER — VALACYCLOVIR HCL 1 G PO TABS: 1000.0000 mg | ORAL_TABLET | Freq: Three times a day (TID) | ORAL | 0 refills | Status: DC

## 2017-11-21 NOTE — Discharge Instructions (Signed)
°  Keep rash clean with warm water and mild soap.    You may use over the counter calamine lotion or hydrocortisone cream to help with discomfort along with Tylenol and Motrin  Please take the antiviral medication- valacyclovir to help shorten the duration and severity of symptoms.   Please follow up with family medicine as needed.

## 2017-11-21 NOTE — ED Provider Notes (Signed)
[facility]    CSN: [id number] Arrival date & time: 11/21/17  1334     History   Chief Complaint Chief Complaint  Patient presents with  . Rash    HPI Jordan Maynard is a 62 y.o. female.   HPI  Jordan Maynard is a 62 y.o. female presenting to UC with c/o Right side chest pain under right arm that has radiated to Right breast for the last 3 days.  She developed a mildly itchy burning rash to Right breast yesterday.  She has not tried anything for her symptoms.  She did have chicken pox when she was younger. Denies fever, chills, n/v/d. Denies recent illness- cough, congestion, body aches. She has been under a little stress due to having a race coming up that she has not trained for yet, otherwise, denies being stressed.   History reviewed. No pertinent past medical history.  Patient Active Problem List   Diagnosis Date Noted  . Myopia of both eyes 11/21/2015  . 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 Lumbar degenerative disc disease 08/13/2012  . Osteopenia 07/03/2011  . DERMATITIS, ATOPIC 09/11/2010  . Herpes simplex virus (HSV) infection 02/14/2010  . Hyperlipidemia 05/27/2009  . INSOMNIA 05/25/2009    Past Surgical History:  Procedure Laterality Date  . SEPTOPLASTY  May 2009    OB History   None      Home Medications    Prior to Admission medications   Medication Sig Start Date End Date Taking? Authorizing Provider  acyclovir (ZOVIRAX) 400 MG tablet Take 1 tablet (400 mg total) by mouth 3 (three) times daily. 05/23/16   Agapito Games, MD  ALPRAZolam Prudy Feeler) 0.5 MG tablet TAKE 1/2 TO 1 TABLET AS NEEDED ANXIETY 05/23/16   Agapito Games, MD  atorvastatin (LIPITOR) 40 MG tablet TAKE 1 TABLET EVERY DAY 12/03/16   Agapito Games, MD  calcium-vitamin D (OSCAL WITH D) 500-200 MG-UNIT per tablet Take 1 tablet by mouth daily.    [provider]  valACYclovir (VALTREX) 1000 MG tablet Take 1 tablet (1,000 mg total) by mouth 3 (three) times daily.  11/21/17   Lurene Shadow, PA-C  zolpidem (AMBIEN) 10 MG tablet Take 1 tablet (10 mg total) by mouth at bedtime. 12/19/16   Agapito Games, MD    Family History Family History  Problem Relation Age of Onset  . Skin cancer Father   . Hypertension Father   . Hypertension Mother     Social History Social History   Tobacco Use  . Smoking status: Former Smoker    Types: Cigarettes    Last attempt to quit: 02/26/2006    Years since quitting: 11.7  . Smokeless tobacco: Never Used  Substance Use Topics  . Alcohol use: Yes    Alcohol/week: 1.0 standard drinks    Types: 1 Standard drinks or equivalent per week  . Drug use: No     Allergies   Patient has no known allergies.   Review of Systems Review of Systems  Constitutional: Negative for chills, fatigue and fever.  Gastrointestinal: Negative for diarrhea, nausea and vomiting.  Musculoskeletal: Positive for myalgias. Negative for arthralgias, neck pain and neck stiffness.  Skin: Positive for rash. Negative for wound.     Physical Exam Triage Vital Signs ED Triage Vitals  Enc Vitals Group     BP 11/21/17 1357 137/85     Pulse Rate 11/21/17 1357 63     Resp --      Temp 11/21/17  1357 98.2 F (36.8 C)     Temp Source 11/21/17 1357 Oral     SpO2 11/21/17 1357 98 %     Weight 11/21/17 1358 120 lb (54.4 kg)     Height 11/21/17 1358 5\' 1"  (1.549 m)     Head Circumference --      Peak Flow --      Pain Score 11/21/17 1358 0     Pain Loc --      Pain Edu? --      Excl. in GC? --    No data found.  Updated Vital Signs BP 137/85 (BP Location: Right Arm)   Pulse 63   Temp 98.2 F (36.8 C) (Oral)   Ht 5\' 1"  (1.549 m)   Wt 120 lb (54.4 kg)   SpO2 98%   BMI 22.67 kg/m   Visual Acuity Right Eye Distance:   Left Eye Distance:   Bilateral Distance:    Right Eye Near:   Left Eye Near:    Bilateral Near:     Physical Exam  Constitutional: She is oriented to person, place, and time. She appears  well-developed and well-nourished. No distress.  HENT:  Head: Normocephalic and atraumatic.  Eyes: EOM are normal.  Neck: Normal range of motion.  Cardiovascular: Normal rate.  Pulmonary/Chest: Effort normal. No respiratory distress.  Right breast, lateral aspect: 2cm area of erythematous grouped papular rash. Minimally tender. No bleeding or drainage. One faint erythematous papule midaxillary area. Minimally tender.     Musculoskeletal: Normal range of motion.  Neurological: She is alert and oriented to person, place, and time.  Skin: Skin is warm and dry. Rash noted. She is not diaphoretic. There is erythema.  Psychiatric: She has a normal mood and affect. Her behavior is normal.  Nursing note and vitals reviewed.    UC Treatments / Results  Labs (all labs ordered are listed, but only abnormal results are displayed) Labs Reviewed - No data to display  EKG None  Radiology No results found.  Procedures Procedures (including critical care time)  Medications Ordered in UC Medications - No data to display  Initial Impression / Assessment and Plan / UC Course  I have reviewed the triage vital signs and the nursing notes.  Pertinent labs & imaging results that were available during my care of the patient were reviewed by me and considered in my medical decision making (see chart for details).     Hx and exam c/w herpes zoster.   Final Clinical Impressions(s) / UC Diagnoses   Final diagnoses:  Herpes zoster without complication     Discharge Instructions      Keep rash clean with warm water and mild soap.    You may use over the counter calamine lotion or hydrocortisone cream to help with discomfort along with Tylenol and Motrin  Please take the antiviral medication- valacyclovir to help shorten the duration and severity of symptoms.   Please follow up with family medicine as needed.     ED Prescriptions    Medication Sig Dispense Auth. Provider    valACYclovir (VALTREX) 1000 MG tablet Take 1 tablet (1,000 mg total) by mouth 3 (three) times daily. 21 tablet Lurene Shadow, PA-C     Controlled Substance Prescriptions [place] Controlled Substance Registry consulted? Not Applicable   Rolla Plate 11/21/17 1433

## 2017-11-21 NOTE — ED Triage Notes (Signed)
Right breast rash, feels swollen x 3.5 days

## 2017-11-25 ENCOUNTER — Other Ambulatory Visit: Payer: Self-pay | Admitting: Family Medicine

## 2017-12-03 ENCOUNTER — Ambulatory Visit (INDEPENDENT_AMBULATORY_CARE_PROVIDER_SITE_OTHER): Payer: BLUE CROSS/BLUE SHIELD | Admitting: Family Medicine

## 2017-12-03 ENCOUNTER — Encounter: Payer: Self-pay | Admitting: Family Medicine

## 2017-12-03 VITALS — BP 130/74 | HR 70 | Ht 61.0 in | Wt 121.0 lb

## 2017-12-03 DIAGNOSIS — Z0000 Encounter for general adult medical examination without abnormal findings: Secondary | ICD-10-CM | POA: Diagnosis not present

## 2017-12-03 DIAGNOSIS — F5101 Primary insomnia: Secondary | ICD-10-CM

## 2017-12-03 LAB — CBC
HCT: 41.8 % (ref 35.0–45.0)
Hemoglobin: 14.4 g/dL (ref 11.7–15.5)
MCH: 31.4 pg (ref 27.0–33.0)
MCHC: 34.4 g/dL (ref 32.0–36.0)
MCV: 91.3 fL (ref 80.0–100.0)
MPV: 10 fL (ref 7.5–12.5)
Platelets: 228 10*3/uL (ref 140–400)
RBC: 4.58 10*6/uL (ref 3.80–5.10)
RDW: 12.6 % (ref 11.0–15.0)
WBC: 5.2 10*3/uL (ref 3.8–10.8)

## 2017-12-03 LAB — COMPLETE METABOLIC PANEL WITH GFR
AG Ratio: 2 (calc) (ref 1.0–2.5)
ALT: 15 U/L (ref 6–29)
AST: 11 U/L (ref 10–35)
Albumin: 4.5 g/dL (ref 3.6–5.1)
Alkaline phosphatase (APISO): 78 U/L (ref 33–130)
BUN: 16 mg/dL (ref 7–25)
CO2: 33 mmol/L — ABNORMAL HIGH (ref 20–32)
Calcium: 10 mg/dL (ref 8.6–10.4)
Chloride: 99 mmol/L (ref 98–110)
Creat: 0.7 mg/dL (ref 0.50–0.99)
GFR, Est African American: 108 mL/min/{1.73_m2} (ref >=60)
GFR, Est Non African American: 94 mL/min/{1.73_m2} (ref >=60)
Globulin: 2.3 g/dL (calc) (ref 1.9–3.7)
Glucose, Bld: 94 mg/dL (ref 65–99)
Potassium: 4.4 mmol/L (ref 3.5–5.3)
Sodium: 139 mmol/L (ref 135–146)
Total Bilirubin: 0.8 mg/dL (ref 0.2–1.2)
Total Protein: 6.8 g/dL (ref 6.1–8.1)

## 2017-12-03 LAB — LIPID PANEL
Cholesterol: 203 mg/dL — ABNORMAL HIGH (ref <200)
HDL: 68 mg/dL (ref >50)
LDL Cholesterol (Calc): 99 mg/dL (calc)
Non-HDL Cholesterol (Calc): 135 mg/dL (calc) — ABNORMAL HIGH (ref <130)
Total CHOL/HDL Ratio: 3 (calc) (ref <5.0)
Triglycerides: 236 mg/dL — ABNORMAL HIGH (ref <150)

## 2017-12-03 MED ORDER — ZOLPIDEM TARTRATE 5 MG PO TABS: 5.0000 mg | ORAL_TABLET | Freq: Every evening | ORAL | 3 refills | Status: DC | PRN

## 2017-12-03 NOTE — Progress Notes (Signed)
Subjective:     Jordan Maynard is a 61 y.o. female and is here for a comprehensive physical exam. The patient reports problems - shingles. She was recently treated with shingles.  She uses her AMbien PRN.  She weaned her self off and  Wants to know if safe to use more PRN. She doesn't sleep well without it but doesn't want to be dependent on it.    Social History   Socioeconomic History  . Marital status: Married    Spouse name: Jillyn Hidden   . Number of children: 0  . Years of education: Not on file  . Highest education level: Not on file  Occupational History  . Occupation: Camera operator: [employer]  Social Needs  . Financial resource strain: Not on file  . Food insecurity:    Worry: Not on file    Inability: Not on file  . Transportation needs:    Medical: Not on file    Non-medical: Not on file  Tobacco Use  . Smoking status: Former Smoker    Types: Cigarettes    Last attempt to quit: 02/26/2006    Years since quitting: 11.7  . Smokeless tobacco: Never Used  Substance and Sexual Activity  . Alcohol use: Yes    Alcohol/week: 1.0 standard drinks    Types: 1 Standard drinks or equivalent per week  . Drug use: No  . Sexual activity: Yes    Partners: Male  Lifestyle  . Physical activity:    Days per week: Not on file    Minutes per session: Not on file  . Stress: Not on file  Relationships  . Social connections:    Talks on phone: Not on file    Gets together: Not on file    Attends religious service: Not on file    Active member of club or organization: Not on file    Attends meetings of clubs or organizations: Not on file    Relationship status: Not on file  . Intimate partner violence:    Fear of current or ex partner: Not on file    Emotionally abused: Not on file    Physically abused: Not on file    Forced sexual activity: Not on file  Other Topics Concern  . 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 She exercises regularly 3-5 days per week. . 2 caffeinated drinks per day. No children.   Health Maintenance  Topic Date Due  . Hepatitis C Screening  1955/04/27  . INFLUENZA VACCINE  12/13/2017 (Originally 09/26/2017)  . MAMMOGRAM  12/06/2018  . TETANUS/TDAP  05/26/2019  . PAP SMEAR  11/29/2020  . DEXA SCAN  12/05/2021  . COLONOSCOPY  08/06/2023  . HIV Screening  Completed    The following portions of the patient's history were reviewed and updated as appropriate: allergies, current medications, past family history, past medical history, past social history, past surgical history and problem list.  Review of Systems A comprehensive review of systems was negative.   Objective:    BP 130/74   Pulse 70   Ht 5\' 1"  (1.549 m)   Wt 121 lb (54.9 kg)   SpO2 98%   BMI 22.86 kg/m  General appearance: alert, cooperative and appears stated age Head: Normocephalic, without obvious abnormality, atraumatic Eyes: conj clear, EOMi, PEERLA Ears: normal TM's and external ear canals both ears Nose: Nares  normal. Septum midline. Mucosa normal. No drainage or sinus tenderness. Throat: lips, mucosa, and tongue normal; teeth and gums normal Neck: no adenopathy, no carotid bruit, no JVD, supple, symmetrical, trachea midline and thyroid not enlarged, symmetric, no tenderness/mass/nodules Back: symmetric, no curvature. ROM normal. No CVA tenderness. Lungs: clear to auscultation bilaterally Breasts: normal appearance, no masses or tenderness Heart: regular rate and rhythm, S1, S2 normal, no murmur, click, rub or gallop Abdomen: soft, non-tender; bowel sounds normal; no masses,  no organomegaly Pelvic: not performed Extremities: extremities normal, atraumatic, no cyanosis or edema Pulses: 2+ and symmetric Skin: Skin color, texture, turgor normal. No rashes or lesions Lymph nodes: Cervical, supraclavicular, and axillary nodes normal. Neurologic: Alert and oriented X 3,  normal strength and tone. Normal symmetric reflexes. Normal coordination and gait    Assessment:    Healthy female exam.      Plan:     See After Visit Summary for Counseling Recommendations   Keep up a regular exercise program and make sure you are eating a healthy diet Try to eat 4 servings of dairy a day, or if you are lactose intolerant take a calcium with vitamin D daily.  Your vaccines are up to date.  Due for mammogram this month.  She exercises regularly.     Chronic insomnia-okay to use Ambien as needed.  I am going to decrease her dose from 10 mg down to 5 she is been splitting them anyway.  And that way she can even split the 5 mg if needed.

## 2017-12-03 NOTE — Patient Instructions (Signed)

## 2017-12-04 ENCOUNTER — Other Ambulatory Visit: Payer: Self-pay | Admitting: Family Medicine

## 2017-12-04 DIAGNOSIS — Z1239 Encounter for other screening for malignant neoplasm of breast: Secondary | ICD-10-CM

## 2017-12-05 ENCOUNTER — Ambulatory Visit: Payer: BLUE CROSS/BLUE SHIELD

## 2017-12-06 ENCOUNTER — Ambulatory Visit (INDEPENDENT_AMBULATORY_CARE_PROVIDER_SITE_OTHER): Payer: BLUE CROSS/BLUE SHIELD

## 2017-12-06 DIAGNOSIS — Z1231 Encounter for screening mammogram for malignant neoplasm of breast: Secondary | ICD-10-CM

## 2017-12-06 DIAGNOSIS — Z1239 Encounter for other screening for malignant neoplasm of breast: Secondary | ICD-10-CM

## 2017-12-23 ENCOUNTER — Emergency Department (INDEPENDENT_AMBULATORY_CARE_PROVIDER_SITE_OTHER): Payer: BLUE CROSS/BLUE SHIELD

## 2017-12-23 ENCOUNTER — Encounter: Payer: Self-pay | Admitting: *Deleted

## 2017-12-23 ENCOUNTER — Other Ambulatory Visit: Payer: Self-pay

## 2017-12-23 ENCOUNTER — Emergency Department (INDEPENDENT_AMBULATORY_CARE_PROVIDER_SITE_OTHER)
Admission: EM | Admit: 2017-12-23 | Discharge: 2017-12-23 | Disposition: A | Discharge status: Home / Self Care | Payer: BLUE CROSS/BLUE SHIELD | Source: Home / Self Care | Attending: Family Medicine | Admitting: Family Medicine

## 2017-12-23 DIAGNOSIS — M25551 Pain in right hip: Secondary | ICD-10-CM

## 2017-12-23 DIAGNOSIS — M79602 Pain in left arm: Secondary | ICD-10-CM

## 2017-12-23 DIAGNOSIS — M5412 Radiculopathy, cervical region: Secondary | ICD-10-CM

## 2017-12-23 HISTORY — DX: Hyperlipidemia, unspecified: E78.5

## 2017-12-23 HISTORY — DX: Insomnia, unspecified: G47.00

## 2017-12-23 MED ORDER — CYCLOBENZAPRINE HCL 5 MG PO TABS: 5.0000 mg | ORAL_TABLET | Freq: Two times a day (BID) | ORAL | 0 refills | Status: DC | PRN

## 2017-12-23 MED ORDER — MELOXICAM 15 MG PO TABS: 15.0000 mg | ORAL_TABLET | Freq: Every day | ORAL | 0 refills | Status: DC

## 2017-12-23 NOTE — ED Provider Notes (Signed)
[facility]    CSN: [id number] Arrival date & time: 12/23/17  1030     History   Chief Complaint Chief Complaint  Patient presents with  . Hip Pain    HPI Jordan Maynard is a 62 y.o. female.   HPI  Jordan Maynard is a 62 y.o. female presenting to UC with c/o 2 weeks worsening Left arm pain that she believes is related to "neck issues" she has had for the last 2 years. Per medical records, pt had an MRI in 2017 which showed disc degeneration at C6-C7 with small central disc protrusion and moderate C7 foraminal stenosis.  She denies new injury. She has taken Advil with mild relief. Pain is worse at night when she is trying to sleep. Pt also c/o Right hip that started after waking up 2 days ago. Pain is aching and sharp at times. Worse with certain movements.  Denies radiation of pain or numbness. No known injury. No prior hx of hip problems.  She is leaving soon for a week long trip to [place] with friends.    Past Medical History:  Diagnosis Date  . Hyperlipidemia   . Insomnia     Patient Active Problem List   Diagnosis Date Noted  . Myopia of both eyes 11/21/2015  . 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 Lumbar degenerative disc disease 08/13/2012  . Osteopenia 07/03/2011  . DERMATITIS, ATOPIC 09/11/2010  . Herpes simplex virus (HSV) infection 02/14/2010  . Hyperlipidemia 05/27/2009  . INSOMNIA 05/25/2009    Past Surgical History:  Procedure Laterality Date  . SEPTOPLASTY  May 2009    OB History   None      Home Medications    Prior to Admission medications   Medication Sig Start Date End Date Taking? Authorizing Provider  acyclovir (ZOVIRAX) 400 MG tablet Take 1 tablet (400 mg total) by mouth 3 (three) times daily. 05/23/16   Agapito Games, MD  ALPRAZolam Prudy Feeler) 0.5 MG tablet TAKE 1/2 TO 1 TABLET AS NEEDED ANXIETY 05/23/16   Agapito Games, MD  atorvastatin (LIPITOR) 40 MG tablet TAKE 1 TABLET EVERY DAY 11/25/17   Agapito Games, MD    calcium-vitamin D (OSCAL WITH D) 500-200 MG-UNIT per tablet Take 1 tablet by mouth daily.    [provider]  cyclobenzaprine (FLEXERIL) 5 MG tablet Take 1-2 tablets (5-10 mg total) by mouth 2 (two) times daily as needed for muscle spasms. 12/23/17   Lurene Shadow, PA-C  meloxicam (MOBIC) 15 MG tablet Take 1 tablet (15 mg total) by mouth daily. 12/23/17   Lurene Shadow, PA-C  zolpidem (AMBIEN) 5 MG tablet Take 1 tablet (5 mg total) by mouth at bedtime as needed for sleep. 12/03/17   Agapito Games, MD    Family History Family History  Problem Relation Age of Onset  . Skin cancer Father   . Hypertension Father   . Hypertension Mother     Social History Social History   Tobacco Use  . Smoking status: Former Smoker    Types: Cigarettes    Last attempt to quit: 02/26/2006    Years since quitting: 11.8  . Smokeless tobacco: Never Used  Substance Use Topics  . Alcohol use: Yes    Alcohol/week: 1.0 standard drinks    Types: 1 Standard drinks or equivalent per week  . Drug use: No     Allergies   Patient has no known allergies.   Review of Systems Review of Systems  Musculoskeletal: Positive for arthralgias, back pain, myalgias and neck pain. Negative for gait problem, joint swelling and neck stiffness.  Skin: Negative for color change and wound.  Neurological: Negative for weakness and numbness.     Physical Exam Triage Vital Signs ED Triage Vitals  Enc Vitals Group     BP 12/23/17 1103 127/85     Pulse Rate 12/23/17 1103 60     Resp 12/23/17 1103 16     Temp 12/23/17 1103 98.4 F (36.9 C)     Temp Source 12/23/17 1103 Oral     SpO2 12/23/17 1103 97 %     Weight 12/23/17 1104 124 lb (56.2 kg)     Height 12/23/17 1104 5\' 1"  (1.549 m)     Head Circumference --      Peak Flow --      Pain Score 12/23/17 1104 7     Pain Loc --      Pain Edu? --      Excl. in GC? --    No data found.  Updated Vital Signs BP 127/85 (BP Location: Right Arm)    Pulse 60   Temp 98.4 F (36.9 C) (Oral)   Resp 16   Ht 5\' 1"  (1.549 m)   Wt 124 lb (56.2 kg)   SpO2 97%   BMI 23.43 kg/m   Visual Acuity Right Eye Distance:   Left Eye Distance:   Bilateral Distance:    Right Eye Near:   Left Eye Near:    Bilateral Near:     Physical Exam  Constitutional: She is oriented to person, place, and time. She appears well-developed and well-nourished.  HENT:  Head: Normocephalic and atraumatic.  Eyes: EOM are normal.  Neck: Normal range of motion. Neck supple.  No midline bone tenderness, no crepitus or step-offs. Full ROM w/o muscular tenderness.   Cardiovascular: Normal rate.  Pulmonary/Chest: Effort normal.  Musculoskeletal: Normal range of motion. She exhibits tenderness. She exhibits no edema.  No midline spinal tenderness. Tenderness to Left upper arm. Full ROM  Right hip: tenderness to surrounding muscles. Full ROM. Increased pain with hip flexion against resistance. Normal gait.   Neurological: She is alert and oriented to person, place, and time.  Skin: Skin is warm and dry. No rash noted. No erythema.  Psychiatric: She has a normal mood and affect. Her behavior is normal.  Nursing note and vitals reviewed.    UC Treatments / Results  Labs (all labs ordered are listed, but only abnormal results are displayed) Labs Reviewed - No data to display  EKG None  Radiology Dg Hip Unilat W Or Wo Pelvis 2-3 Views Right  Result Date: 12/23/2017 CLINICAL DATA:  Right posterolateral hip pain for the past 2 days with limited range of motion. No report of injury. EXAM: DG HIP (WITH OR WITHOUT PELVIS) 2-3V RIGHT COMPARISON:  None. FINDINGS: The bony pelvis is subjectively adequately mineralized. There is no lytic or blastic lesion. AP and lateral views of the right hip reveal mild asymmetric joint space narrowing. The articular surfaces of the right femoral head and acetabulum remains smoothly rounded. The femoral neck, intertrochanteric, and  immediate subtrochanteric regions are normal. IMPRESSION: There is no acute bony abnormality. Mild degenerative joint space narrowing. Electronically Signed   By: David  [place] M.D.   On: 12/23/2017 11:50    Procedures Procedures (including critical care time)  Medications Ordered in UC Medications - No data to display  Initial Impression / Assessment and Plan /  UC Course  I have reviewed the triage vital signs and the nursing notes.  Pertinent labs & imaging results that were available during my care of the patient were reviewed by me and considered in my medical decision making (see chart for details).    Medical records reviewed Hx and exam c/w OA Encouraged conservative tx at this time  Final Clinical Impressions(s) / UC Diagnoses   Final diagnoses:  Right hip pain  Left arm pain  Left cervical radiculopathy     Discharge Instructions      Meloxicam (Mobic) is an antiinflammatory to help with pain and inflammation.  Do not take ibuprofen, Advil, Aleve, or any other medications that contain NSAIDs while taking meloxicam as this may cause stomach upset or even ulcers if taken in large amounts for an extended period of time.   Flexeril (cyclobenzaprine) is a muscle relaxer and may cause drowsiness. Do not drink alcohol, drive, or operate heavy machinery while taking.  Please follow up with family medicine or Sports medicine for recheck of symptoms and additional treatment if needed.    ED Prescriptions    Medication Sig Dispense Auth. Provider   meloxicam (MOBIC) 15 MG tablet Take 1 tablet (15 mg total) by mouth daily. 30 tablet Doroteo Glassman, Sequan Auxier O, PA-C   cyclobenzaprine (FLEXERIL) 5 MG tablet Take 1-2 tablets (5-10 mg total) by mouth 2 (two) times daily as needed for muscle spasms. 30 tablet Lurene Shadow, PA-C     Controlled Substance Prescriptions [place] Controlled Substance Registry consulted? Not Applicable   Rolla Plate 12/23/17 1348

## 2017-12-23 NOTE — Discharge Instructions (Addendum)
°  Meloxicam (Mobic) is an antiinflammatory to help with pain and inflammation.  Do not take ibuprofen, Advil, Aleve, or any other medications that contain NSAIDs while taking meloxicam as this may cause stomach upset or even ulcers if taken in large amounts for an extended period of time.   Flexeril (cyclobenzaprine) is a muscle relaxer and may cause drowsiness. Do not drink alcohol, drive, or operate heavy machinery while taking.  Please follow up with family medicine or Sports medicine for recheck of symptoms and additional treatment if needed.

## 2017-12-23 NOTE — ED Triage Notes (Signed)
Pt c/o "neck issues" x 2 years with LT arm pain x 2wks. She also awoke with RT hip pain x 2 days. Denies injury. Reports some relief with Advil.

## 2018-02-03 ENCOUNTER — Other Ambulatory Visit: Payer: Self-pay | Admitting: *Deleted

## 2018-02-03 MED ORDER — ACYCLOVIR 400 MG PO TABS: 400.0000 mg | ORAL_TABLET | Freq: Three times a day (TID) | ORAL | 99 refills | Status: DC

## 2018-02-24 ENCOUNTER — Encounter: Payer: Self-pay | Admitting: Family Medicine

## 2018-05-02 ENCOUNTER — Encounter: Payer: Self-pay | Admitting: Family Medicine

## 2018-05-05 ENCOUNTER — Ambulatory Visit (INDEPENDENT_AMBULATORY_CARE_PROVIDER_SITE_OTHER): Payer: BLUE CROSS/BLUE SHIELD | Admitting: Family Medicine

## 2018-05-05 VITALS — BP 130/74 | HR 70 | Temp 97.8°F | Wt 120.0 lb

## 2018-05-05 DIAGNOSIS — Z23 Encounter for immunization: Secondary | ICD-10-CM

## 2018-05-05 NOTE — Progress Notes (Signed)
Pt in today for shingrix vaccine. This is 1 of 2 of the shingrix series. Vitals taken and no fever noted. Vaccine was given in left deltoid. Pt tolerated well with no immediate complications. Pt advised to return in 2 months for 2nd vaccine.

## 2018-05-05 NOTE — Progress Notes (Signed)
Agree with documentation as above.   Eshaal Duby, MD  

## 2018-06-04 ENCOUNTER — Ambulatory Visit: Payer: BLUE CROSS/BLUE SHIELD | Admitting: Family Medicine

## 2018-06-08 IMAGING — DX DG CERVICAL SPINE COMPLETE 4+V
5 series · 5 of 5 positions shown · non-contrast
Comparison: None.

CLINICAL DATA: Chronic cervicalgia with left-sided radicular
symptoms. No history of recent trauma

EXAM:
CERVICAL SPINE - COMPLETE 4+ VIEW

[c-spine lat]
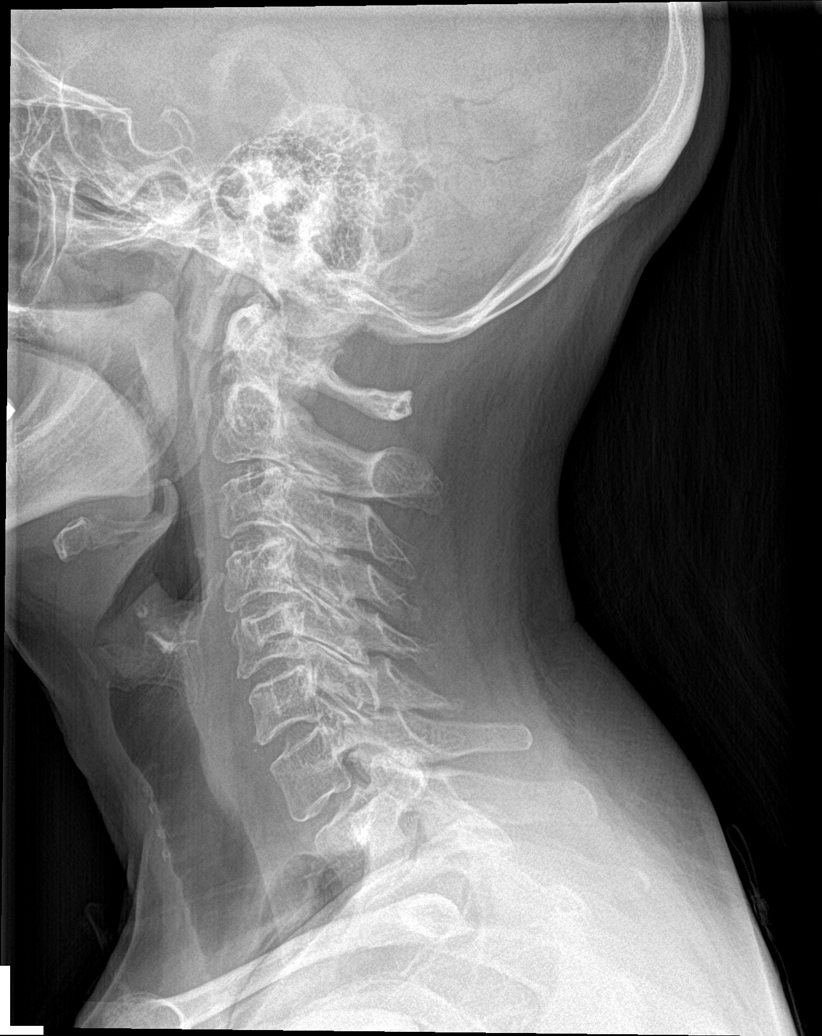

[c-spine obl (1 of 2)]
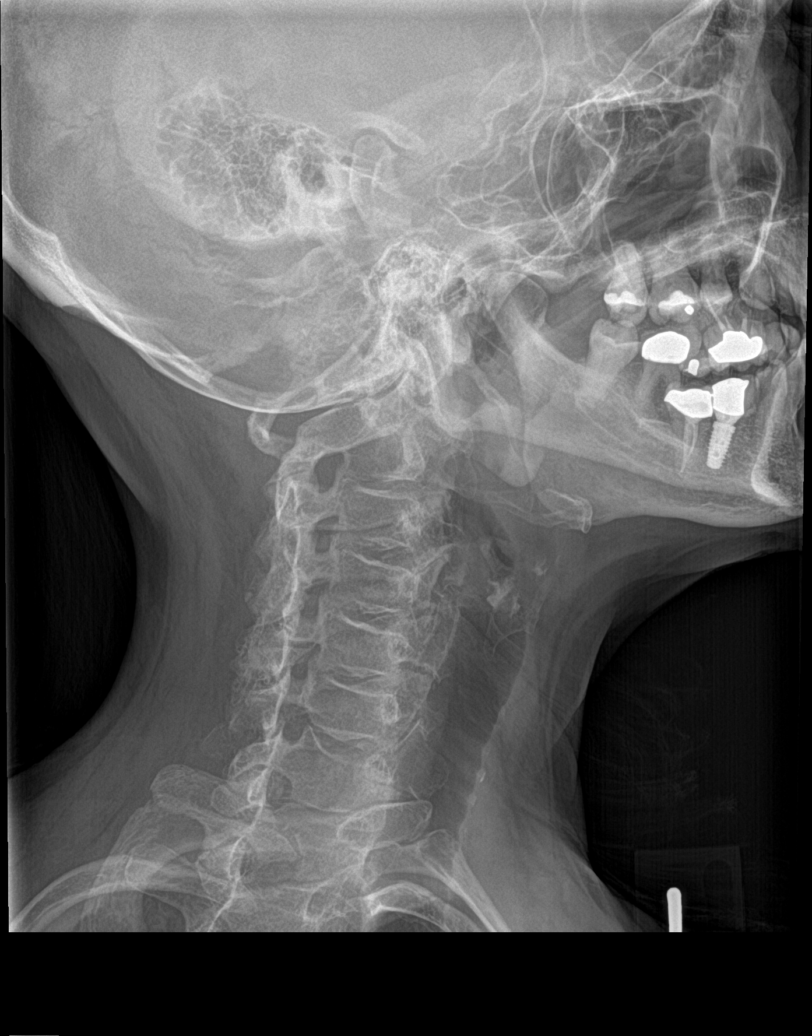

[c-spine obl (2 of 2)]
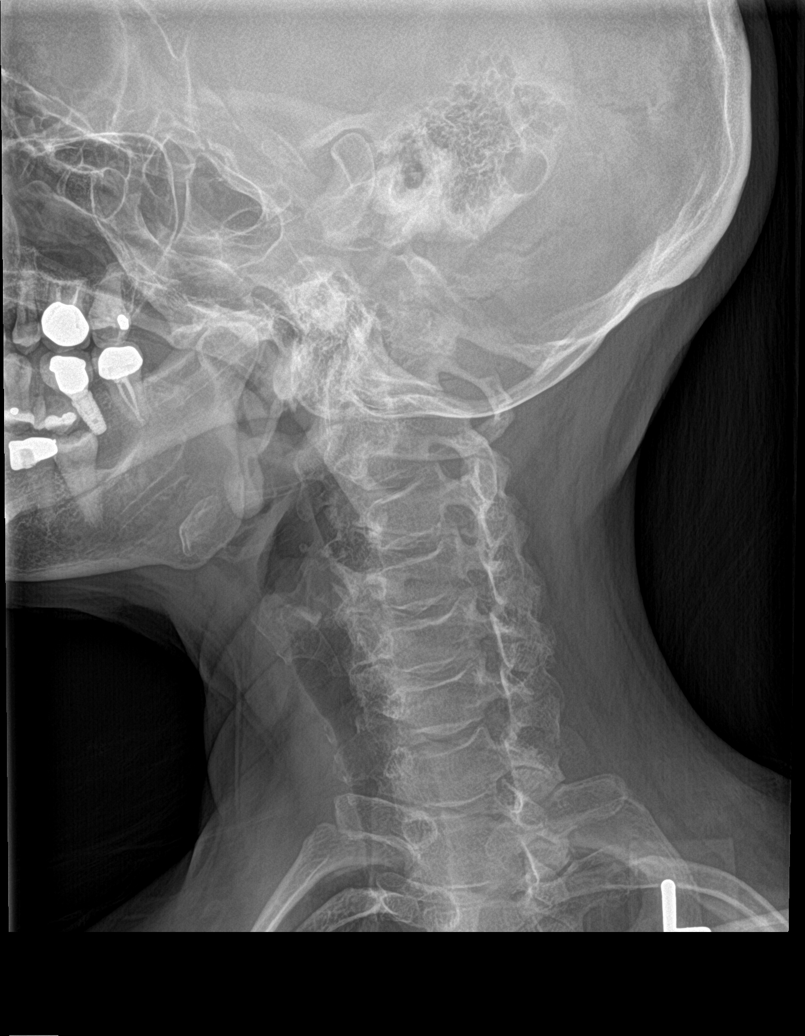

[c-spine ap]
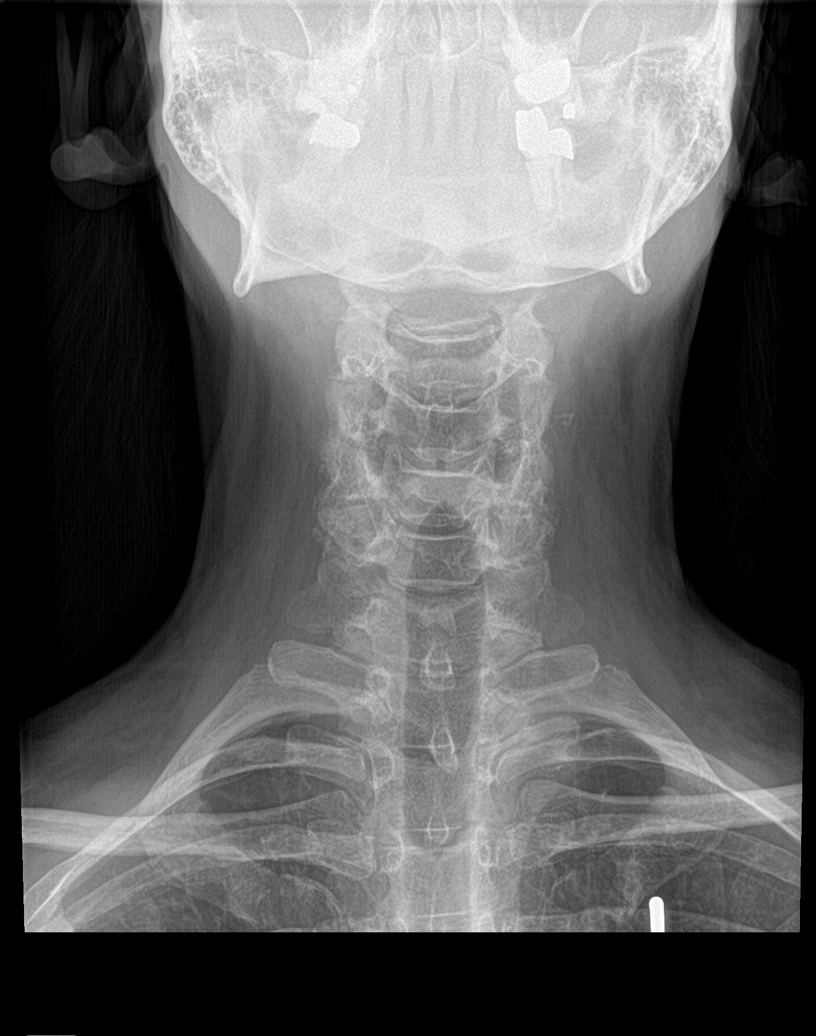

[c-spine open mouth]
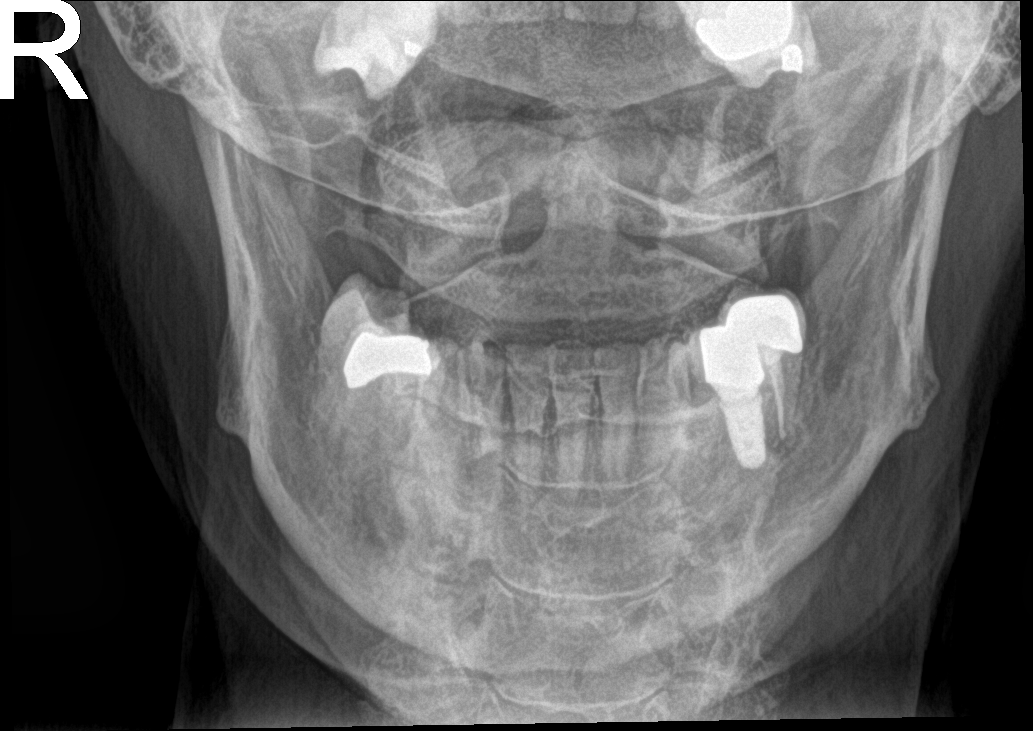

[5 of 5 positions shown; findings below may reference images not displayed]

FINDINGS: Frontal, lateral, open-mouth odontoid, and bilateral oblique views
were obtained. There is no fracture or spondylolisthesis.
Prevertebral soft tissues and predental space regions are normal.
There is mild disc space narrowing at C4-5 and C5-6. The disc spaces
appear unremarkable. There is facet hypertrophy with exit foraminal
narrowing due to bony hypertrophy at C3-4, C4-5, and C5-6
bilaterally. There is calcification in the left carotid artery.
IMPRESSION: Osteoarthritic changes several levels. No fracture or
spondylolisthesis. Mild left carotid artery calcification.

## 2018-06-19 ENCOUNTER — Telehealth (INDEPENDENT_AMBULATORY_CARE_PROVIDER_SITE_OTHER): Payer: BLUE CROSS/BLUE SHIELD | Admitting: Family Medicine

## 2018-06-19 ENCOUNTER — Encounter: Payer: Self-pay | Admitting: Family Medicine

## 2018-06-19 VITALS — HR 66 | Ht 61.0 in | Wt 120.0 lb

## 2018-06-19 DIAGNOSIS — R635 Abnormal weight gain: Secondary | ICD-10-CM | POA: Diagnosis not present

## 2018-06-19 DIAGNOSIS — F5101 Primary insomnia: Secondary | ICD-10-CM | POA: Diagnosis not present

## 2018-06-19 MED ORDER — ZOLPIDEM TARTRATE 5 MG PO TABS: 5.0000 mg | ORAL_TABLET | Freq: Every evening | ORAL | 1 refills | Status: DC | PRN

## 2018-06-19 NOTE — Progress Notes (Signed)
Virtual Visit via Video Note  I connected with Jordan Maynard on 06/19/18 at  9:50 AM EDT by a video enabled telemedicine application we wre unable to get voice connected so had to change to telephone call. and verified that I am speaking with the correct person using two identifiers.   I discussed the limitations of evaluation and management by telemedicine and the availability of in person appointments. The patient expressed understanding and agreed to proceed.  Subjective:    CC: sleep medication.   HPI:  F/u inosmnia - she is dong well on her Ambien with no side effects or problems.  Needs refills on her medication.    She is doing well overall. Staying home. no URI ssx.    Weight gain. She has gained some well since November.  She is not sure if bc of her age. Has been exercising and has been watching her diet. Even used an app to help her lose weight just can't seem to lose more than a pound. Running for exercise. Only playing singles for tennis   Past medical history, Surgical history, Family history not pertinant except as noted below, Social history, Allergies, and medications have been entered into the medical record, reviewed, and corrections made.   Review of Systems: No fevers, chills, night sweats, weight loss, chest pain, or shortness of breath.   Objective:    General: Speaking clearly in complete sentences without any shortness of breath.  Alert and oriented x3.  Normal judgment. No apparent acute distress.    Impression and Recommendations:    Insomnia - Doing well. Will refill her medication.   Abnormal weight gain - we could check Thyroid levels.  Her mom had hypothyroid but died when 69.  No results found for: TSH   She is due for 2nd shingles vaccine next month so will get labs at that time.      I discussed the assessment and treatment plan with the patient. The patient was provided an opportunity to ask questions and all were answered. The patient agreed  with the plan and demonstrated an understanding of the instructions.   The patient was advised to call back or seek an in-person evaluation if the symptoms worsen or if the condition fails to improve as anticipated.   Nani Gasser, MD

## 2018-07-03 LAB — TSH+FREE T4: TSH W/REFLEX TO FT4: 2.09 mIU/L (ref 0.40–4.50)

## 2018-07-10 ENCOUNTER — Other Ambulatory Visit: Payer: Self-pay

## 2018-07-10 ENCOUNTER — Ambulatory Visit (INDEPENDENT_AMBULATORY_CARE_PROVIDER_SITE_OTHER): Payer: BLUE CROSS/BLUE SHIELD | Admitting: Sports Medicine

## 2018-07-10 VITALS — Temp 98.2°F

## 2018-07-10 DIAGNOSIS — Z23 Encounter for immunization: Secondary | ICD-10-CM

## 2018-07-10 NOTE — Progress Notes (Signed)
Patient reports to clinic for second shingles shot. Denies any fever or sickness during last 2 weeks. Patient tolerated injection well without any immediate complications.

## 2018-11-07 ENCOUNTER — Other Ambulatory Visit: Payer: Self-pay | Admitting: Family Medicine

## 2018-11-07 ENCOUNTER — Encounter: Payer: Self-pay | Admitting: Family Medicine

## 2018-11-07 DIAGNOSIS — Z0000 Encounter for general adult medical examination without abnormal findings: Secondary | ICD-10-CM

## 2018-11-07 DIAGNOSIS — Z1159 Encounter for screening for other viral diseases: Secondary | ICD-10-CM

## 2018-11-07 DIAGNOSIS — E7849 Other hyperlipidemia: Secondary | ICD-10-CM

## 2018-11-07 DIAGNOSIS — Z1231 Encounter for screening mammogram for malignant neoplasm of breast: Secondary | ICD-10-CM

## 2018-11-07 NOTE — Telephone Encounter (Signed)
Dr. Madilyn Fireman,  Can you order her labwork for her CPE?  She is scheduled on 12/03/18.

## 2018-11-10 NOTE — Telephone Encounter (Signed)
Lab order placed.

## 2018-11-11 ENCOUNTER — Other Ambulatory Visit: Payer: Self-pay | Admitting: Family Medicine

## 2018-12-17 ENCOUNTER — Other Ambulatory Visit: Payer: Self-pay

## 2018-12-17 ENCOUNTER — Ambulatory Visit (INDEPENDENT_AMBULATORY_CARE_PROVIDER_SITE_OTHER): Payer: BLUE CROSS/BLUE SHIELD

## 2018-12-17 DIAGNOSIS — Z1231 Encounter for screening mammogram for malignant neoplasm of breast: Secondary | ICD-10-CM

## 2018-12-17 LAB — CBC
HCT: 40.7 % (ref 35.0–45.0)
Hemoglobin: 14.1 g/dL (ref 11.7–15.5)
MCH: 32 pg (ref 27.0–33.0)
MCHC: 34.6 g/dL (ref 32.0–36.0)
MCV: 92.5 fL (ref 80.0–100.0)
MPV: 10.8 fL (ref 7.5–12.5)
Platelets: 223 10*3/uL (ref 140–400)
RBC: 4.4 10*6/uL (ref 3.80–5.10)
RDW: 12 % (ref 11.0–15.0)
WBC: 4.5 10*3/uL (ref 3.8–10.8)

## 2018-12-17 LAB — LIPID PANEL
Cholesterol: 155 mg/dL (ref <200)
HDL: 57 mg/dL (ref >=50)
LDL Cholesterol (Calc): 77 mg/dL (calc)
Non-HDL Cholesterol (Calc): 98 mg/dL (calc) (ref <130)
Total CHOL/HDL Ratio: 2.7 (calc) (ref <5.0)
Triglycerides: 119 mg/dL (ref <150)

## 2018-12-17 LAB — COMPLETE METABOLIC PANEL WITH GFR
AG Ratio: 1.9 (calc) (ref 1.0–2.5)
ALT: 15 U/L (ref 6–29)
AST: 17 U/L (ref 10–35)
Albumin: 4.2 g/dL (ref 3.6–5.1)
Alkaline phosphatase (APISO): 70 U/L (ref 37–153)
BUN: 14 mg/dL (ref 7–25)
CO2: 30 mmol/L (ref 20–32)
Calcium: 9.1 mg/dL (ref 8.6–10.4)
Chloride: 105 mmol/L (ref 98–110)
Creat: 0.71 mg/dL (ref 0.50–0.99)
GFR, Est African American: 106 mL/min/{1.73_m2} (ref >=60)
GFR, Est Non African American: 91 mL/min/{1.73_m2} (ref >=60)
Globulin: 2.2 g/dL (calc) (ref 1.9–3.7)
Glucose, Bld: 92 mg/dL (ref 65–99)
Potassium: 4.6 mmol/L (ref 3.5–5.3)
Sodium: 141 mmol/L (ref 135–146)
Total Bilirubin: 0.7 mg/dL (ref 0.2–1.2)
Total Protein: 6.4 g/dL (ref 6.1–8.1)

## 2018-12-17 LAB — TSH: TSH: 1.93 mIU/L (ref 0.40–4.50)

## 2018-12-17 LAB — HEPATITIS C ANTIBODY
Hepatitis C Ab: NONREACTIVE
SIGNAL TO CUT-OFF: 0.01 (ref <1.00)

## 2018-12-23 ENCOUNTER — Other Ambulatory Visit: Payer: Self-pay

## 2018-12-23 ENCOUNTER — Encounter: Payer: Self-pay | Admitting: Family Medicine

## 2018-12-23 ENCOUNTER — Ambulatory Visit (INDEPENDENT_AMBULATORY_CARE_PROVIDER_SITE_OTHER): Payer: BLUE CROSS/BLUE SHIELD | Admitting: Family Medicine

## 2018-12-23 VITALS — BP 125/58 | HR 57 | Ht 61.0 in | Wt 115.0 lb

## 2018-12-23 DIAGNOSIS — Z0000 Encounter for general adult medical examination without abnormal findings: Secondary | ICD-10-CM | POA: Diagnosis not present

## 2018-12-23 DIAGNOSIS — M542 Cervicalgia: Secondary | ICD-10-CM

## 2018-12-23 DIAGNOSIS — R05 Cough: Secondary | ICD-10-CM

## 2018-12-23 DIAGNOSIS — F5101 Primary insomnia: Secondary | ICD-10-CM

## 2018-12-23 DIAGNOSIS — Z23 Encounter for immunization: Secondary | ICD-10-CM | POA: Diagnosis not present

## 2018-12-23 DIAGNOSIS — M8588 Other specified disorders of bone density and structure, other site: Secondary | ICD-10-CM | POA: Diagnosis not present

## 2018-12-23 DIAGNOSIS — R053 Chronic cough: Secondary | ICD-10-CM

## 2018-12-23 MED ORDER — MELOXICAM 15 MG PO TABS: 15.0000 mg | ORAL_TABLET | Freq: Every day | ORAL | 2 refills | Status: DC | PRN

## 2018-12-23 MED ORDER — ZOLPIDEM TARTRATE 5 MG PO TABS: 5.0000 mg | ORAL_TABLET | Freq: Every evening | ORAL | 1 refills | Status: DC | PRN

## 2018-12-23 NOTE — Patient Instructions (Signed)
Health Maintenance, Female Adopting a healthy lifestyle and getting preventive care are important in promoting health and wellness. Ask your health care provider about:  The right schedule for you to have regular tests and exams.  Things you can do on your own to prevent diseases and keep yourself healthy. What should I know about diet, weight, and exercise? Eat a healthy diet   Eat a diet that includes plenty of vegetables, fruits, low-fat dairy products, and lean protein.  Do not eat a lot of foods that are high in solid fats, added sugars, or sodium. Maintain a healthy weight Body mass index (BMI) is used to identify weight problems. It estimates body fat based on height and weight. Your health care provider can help determine your BMI and help you achieve or maintain a healthy weight. Get regular exercise Get regular exercise. This is one of the most important things you can do for your health. Most adults should:  Exercise for at least 150 minutes each week. The exercise should increase your heart rate and make you sweat (moderate-intensity exercise).  Do strengthening exercises at least twice a week. This is in addition to the moderate-intensity exercise.  Spend less time sitting. Even light physical activity can be beneficial. Watch cholesterol and blood lipids Have your blood tested for lipids and cholesterol at 62 years of age, then have this test every 5 years. Have your cholesterol levels checked more often if:  Your lipid or cholesterol levels are high.  You are older than 62 years of age.  You are at high risk for heart disease. What should I know about cancer screening? Depending on your health history and family history, you may need to have cancer screening at various ages. This may include screening for:  Breast cancer.  Cervical cancer.  Colorectal cancer.  Skin cancer.  Lung cancer. What should I know about heart disease, diabetes, and high blood  pressure? Blood pressure and heart disease  High blood pressure causes heart disease and increases the risk of stroke. This is more likely to develop in people who have high blood pressure readings, are of African descent, or are overweight.  Have your blood pressure checked: ? Every 3-5 years if you are 18-39 years of age. ? Every year if you are 40 years old or older. Diabetes Have regular diabetes screenings. This checks your fasting blood sugar level. Have the screening done:  Once every three years after age 40 if you are at a normal weight and have a low risk for diabetes.  More often and at a younger age if you are overweight or have a high risk for diabetes. What should I know about preventing infection? Hepatitis B If you have a higher risk for hepatitis B, you should be screened for this virus. Talk with your health care provider to find out if you are at risk for hepatitis B infection. Hepatitis C Testing is recommended for:  Everyone born from 1945 through 1965.  Anyone with known risk factors for hepatitis C. Sexually transmitted infections (STIs)  Get screened for STIs, including gonorrhea and chlamydia, if: ? You are sexually active and are younger than 62 years of age. ? You are older than 62 years of age and your health care provider tells you that you are at risk for this type of infection. ? Your sexual activity has changed since you were last screened, and you are at increased risk for chlamydia or gonorrhea. Ask your health care provider if   you are at risk.  Ask your health care provider about whether you are at high risk for HIV. Your health care provider may recommend a prescription medicine to help prevent HIV infection. If you choose to take medicine to prevent HIV, you should first get tested for HIV. You should then be tested every 3 months for as long as you are taking the medicine. Pregnancy  If you are about to stop having your period (premenopausal) and  you may become pregnant, seek counseling before you get pregnant.  Take 400 to 800 micrograms (mcg) of folic acid every day if you become pregnant.  Ask for birth control (contraception) if you want to prevent pregnancy. Osteoporosis and menopause Osteoporosis is a disease in which the bones lose minerals and strength with aging. This can result in bone fractures. If you are 65 years old or older, or if you are at risk for osteoporosis and fractures, ask your health care provider if you should:  Be screened for bone loss.  Take a calcium or vitamin D supplement to lower your risk of fractures.  Be given hormone replacement therapy (HRT) to treat symptoms of menopause. Follow these instructions at home: Lifestyle  Do not use any products that contain nicotine or tobacco, such as cigarettes, e-cigarettes, and chewing tobacco. If you need help quitting, ask your health care provider.  Do not use street drugs.  Do not share needles.  Ask your health care provider for help if you need support or information about quitting drugs. Alcohol use  Do not drink alcohol if: ? Your health care provider tells you not to drink. ? You are pregnant, may be pregnant, or are planning to become pregnant.  If you drink alcohol: ? Limit how much you use to 0-1 drink a day. ? Limit intake if you are breastfeeding.  Be aware of how much alcohol is in your drink. In the [place] one drink equals one 12 oz bottle of beer (355 mL), one 5 oz glass of wine (148 mL), or one 1 oz glass of hard liquor (44 mL). General instructions  Schedule regular health, dental, and eye exams.  Stay current with your vaccines.  Tell your health care provider if: ? You often feel depressed. ? You have ever been abused or do not feel safe at home. Summary  Adopting a healthy lifestyle and getting preventive care are important in promoting health and wellness.  Follow your health care provider's instructions about healthy  diet, exercising, and getting tested or screened for diseases.  Follow your health care provider's instructions on monitoring your cholesterol and blood pressure. This information is not intended to replace advice given to you by your health care provider. Make sure you discuss any questions you have with your health care provider. Document Released: 08/28/2010 Document Revised: 02/05/2018 Document Reviewed: 02/05/2018 Elsevier Patient Education  2020 [employer]  

## 2018-12-23 NOTE — Progress Notes (Addendum)
Subjective:     Jordan Maynard is a 63 y.o. female and is here for a comprehensive physical exam. The patient reports problems - neck pain. She is doing well overall. She is exercising.  She does c/o of neck pain for a long time. . Says it bothers her about 1-2 x per week.  Says had some old meloxicam -  does help. She uses her stretching, yoga, and regular massage to help.  Would like a rx for the meloxicam. .    She also has a chronic cough.  Has tired Zyrtec fot his but that makes her feel strange.  She says the cough is worse at night. Hasn't tried anthing else.    Social History   Socioeconomic History  . Marital status: Married    Spouse name: Jillyn Hidden   . Number of children: 0  . Years of education: Not on file  . Highest education level: Not on file  Occupational History  . Occupation: Camera operator: [employer]  Social Needs  . Financial resource strain: Not on file  . Food insecurity    Worry: Not on file    Inability: Not on file  . Transportation needs    Medical: Not on file    Non-medical: Not on file  Tobacco Use  . Smoking status: Former Smoker    Types: Cigarettes    Quit date: 02/26/2006    Years since quitting: 12.8  . Smokeless tobacco: Never Used  Substance and Sexual Activity  . Alcohol use: Yes    Alcohol/week: 1.0 standard drinks    Types: 1 Standard drinks or equivalent per week  . Drug use: No  . Sexual activity: Yes    Partners: Male  Lifestyle  . Physical activity    Days per week: Not on file    Minutes per session: Not on file  . Stress: Not on file  Relationships  . Social Musician on phone: Not on file    Gets together: Not on file    Attends religious service: Not on file    Active member of club or organization: Not on file    Attends meetings of clubs or organizations: Not on file    Relationship status: Not on file  . Intimate partner violence    Fear of current or ex partner: Not on file    Emotionally  abused: Not on file    Physically abused: Not on file    Forced sexual activity: Not on file  Other Topics Concern  . 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 She exercises regularly 3-5 days per week. . 2 caffeinated drinks per day. No children.   Health Maintenance  Topic Date Due  . TETANUS/TDAP  05/26/2019  . PAP SMEAR-Modifier  11/29/2020  . MAMMOGRAM  12/16/2020  . DEXA SCAN  12/05/2021  . COLONOSCOPY  08/06/2023  . INFLUENZA VACCINE  Completed  . Hepatitis C Screening  Completed  . HIV Screening  Completed    The following portions of the patient's history were reviewed and updated as appropriate: allergies, current medications, past family history, past medical history, past social history, past surgical history and problem list.  Review of Systems A comprehensive review of systems was negative.   Objective:    BP (!) 125/58   Pulse (!) 57   Ht 5\' 1"  (1.549  m)   Wt 115 lb (52.2 kg)   SpO2 100%   BMI 21.73 kg/m  General appearance: alert, cooperative and appears stated age Head: Normocephalic, without obvious abnormality, atraumatic Eyes: conj clear, EOMI, PEERLA Ears: normal TM's and external ear canals both ears Nose: Nares normal. Septum midline. Mucosa normal. No drainage or sinus tenderness. Throat: lips, mucosa, and tongue normal; teeth and gums normal Neck: no adenopathy, no carotid bruit, no JVD, supple, symmetrical, trachea midline and thyroid not enlarged, symmetric, no tenderness/mass/nodules Back: symmetric, no curvature. ROM normal. No CVA tenderness. Lungs: clear to auscultation bilaterally Breasts: not examined Heart: regular rate and rhythm, S1, S2 normal, no murmur, click, rub or gallop Abdomen: soft, non-tender; bowel sounds normal; no masses,  no organomegaly Extremities: extremities normal, atraumatic, no cyanosis or edema Pulses: 2+ and symmetric Skin: Skin  color, texture, turgor normal. No rashes or lesions Lymph nodes: Cervical adenopathy: nl and Supraclavicular adenopathy: nl Neurologic: Alert and oriented X 3, normal strength and tone. Normal symmetric reflexes. Normal coordination and gait    Assessment:    Healthy female exam.     Plan:     See After Visit Summary for Counseling Recommendations   Keep up a regular exercise program and make sure you are eating a healthy diet Try to eat 4 servings of dairy a day, or if you are lactose intolerant take a calcium with vitamin D daily.  Your vaccines are up to date.   Osteopenia - bone density is 2.0. due for repeat bone density. Continue calcium and vitamin D today.    Neck pain - will send rx for meloxicam.  Continue conservative modalities.  Call if getting worse.    Chronic cough - Recommend trial of claritin and if not helping then consider PPI.

## 2018-12-23 NOTE — Addendum Note (Signed)
Addended by: Beatrice Lecher D on: 12/23/2018 01:41 PM   Modules accepted: Orders

## 2018-12-31 ENCOUNTER — Other Ambulatory Visit: Payer: BLUE CROSS/BLUE SHIELD

## 2018-12-31 ENCOUNTER — Ambulatory Visit (INDEPENDENT_AMBULATORY_CARE_PROVIDER_SITE_OTHER): Payer: BLUE CROSS/BLUE SHIELD

## 2018-12-31 ENCOUNTER — Other Ambulatory Visit: Payer: Self-pay

## 2018-12-31 DIAGNOSIS — M8588 Other specified disorders of bone density and structure, other site: Secondary | ICD-10-CM

## 2019-02-08 ENCOUNTER — Encounter: Payer: Self-pay | Admitting: Family Medicine

## 2019-03-02 ENCOUNTER — Encounter: Payer: Self-pay | Admitting: Family Medicine

## 2019-03-12 ENCOUNTER — Encounter: Payer: Self-pay | Admitting: Family Medicine

## 2019-03-12 ENCOUNTER — Ambulatory Visit (INDEPENDENT_AMBULATORY_CARE_PROVIDER_SITE_OTHER): Payer: BC Managed Care – PPO | Admitting: Family Medicine

## 2019-03-12 VITALS — Ht 61.0 in | Wt 115.0 lb

## 2019-03-12 DIAGNOSIS — R4589 Other symptoms and signs involving emotional state: Secondary | ICD-10-CM | POA: Diagnosis not present

## 2019-03-12 NOTE — Progress Notes (Signed)
Virtual Visit via Video Note  I connected with Ewing Schlein on 03/13/19 at  8:50 AM EST by a video enabled telemedicine application and verified that I am speaking with the correct person using two identifiers.   I discussed the limitations of evaluation and management by telemedicine and the availability of in person appointments. The patient expressed understanding and agreed to proceed.  Subjective:    CC: Needs letter  HPI: Jermya and her husband are planning on moving to [place].  They have been spending 1 month at a time down there but are planning on buying a new home and staying at least 6 months out of the year and then spending the other 6 months back here.  The place that they are interested in does not allow animals.  She and her husband do not have any children and rely on their cat for emotional support.  It is really important for them to be able to keep their fur-baby.  She is otherwise doing well.   Past medical history, Surgical history, Family history not pertinant except as noted below, Social history, Allergies, and medications have been entered into the medical record, reviewed, and corrections made.   Review of Systems: No fevers, chills, night sweats, weight loss, chest pain, or shortness of breath.   Objective:    General: Speaking clearly in complete sentences without any shortness of breath.  Alert and oriented x3.  Normal judgment. No apparent acute distress.    Impression and Recommendations:    Provide letter requesting that her cat be allowed in their home for emotional support.  We will try to get a printed signed copy either sent through MyChart or via email.     I discussed the assessment and treatment plan with the patient. The patient was provided an opportunity to ask questions and all were answered. The patient agreed with the plan and demonstrated an understanding of the instructions.   The patient was advised to call back or seek an in-person  evaluation if the symptoms worsen or if the condition fails to improve as anticipated.   Nani Gasser, MD

## 2019-03-12 NOTE — Progress Notes (Signed)
Letter for her animal at her vacation home in [place].

## 2019-03-13 ENCOUNTER — Encounter: Payer: Self-pay | Admitting: Family Medicine

## 2019-06-10 ENCOUNTER — Other Ambulatory Visit: Payer: Self-pay | Admitting: Family Medicine

## 2019-06-23 ENCOUNTER — Ambulatory Visit (INDEPENDENT_AMBULATORY_CARE_PROVIDER_SITE_OTHER): Payer: BC Managed Care – PPO | Admitting: Family Medicine

## 2019-06-23 ENCOUNTER — Other Ambulatory Visit: Payer: Self-pay

## 2019-06-23 ENCOUNTER — Encounter: Payer: Self-pay | Admitting: Family Medicine

## 2019-06-23 VITALS — BP 120/62 | HR 59 | Ht 61.0 in | Wt 112.0 lb

## 2019-06-23 DIAGNOSIS — M5136 Other intervertebral disc degeneration, lumbar region: Secondary | ICD-10-CM

## 2019-06-23 DIAGNOSIS — F5101 Primary insomnia: Secondary | ICD-10-CM | POA: Diagnosis not present

## 2019-06-23 MED ORDER — ZOLPIDEM TARTRATE 5 MG PO TABS: 5.0000 mg | ORAL_TABLET | Freq: Every evening | ORAL | 1 refills | Status: DC | PRN

## 2019-06-23 NOTE — Assessment & Plan Note (Signed)
Doing well on Mobic as needed.  Did remind her to just use as needed she says she maybe takes it a couple times every 2 weeks to avoid any risk or insult to her renal function which has been normal.

## 2019-06-23 NOTE — Progress Notes (Signed)
Established Patient Office Visit  Subjective:  Patient ID: Jordan Maynard, female    DOB: 07-28-1955  Age: 64 y.o. MRN: [id number]  CC:  Chief Complaint  Patient presents with  . Insomnia  . Hypertension    HPI Jordan Maynard presents for   Follow-up lumbar degenerative disc disease-currently uses an anti-inflammatory as needed.  He does use her Mobic as needed and says it does work well.  She has been using or recently for her neck she is also been using a topical cannabis cream which also seems to help and usually if that does not provide enough relief is when she will take her Mobic.  Follow-up insomnia.  Currently uses Ambien 5 mg.  She denies any significant problems or side effect.  Does need refill sent to her pharmacy.  She has had her Covid vaccination series.  He and her husband have bought a condo in [place].  Past Medical History:  Diagnosis Date  . Hyperlipidemia   . Insomnia     Past Surgical History:  Procedure Laterality Date  . SEPTOPLASTY  May 2009    Family History  Problem Relation Age of Onset  . Skin cancer Father   . Hypertension Father   . Hypertension Mother     Social History   Socioeconomic History  . Marital status: Married    Spouse name: Jillyn Hidden   . Number of children: 0  . Years of education: Not on file  . Highest education level: Not on file  Occupational History  . Occupation: Camera operator: [employer]  Tobacco Use  . Smoking status: Former Smoker    Types: Cigarettes    Quit date: 02/26/2006    Years since quitting: 13.3  . Smokeless tobacco: Never Used  Substance and Sexual Activity  . Alcohol use: Yes    Alcohol/week: 1.0 standard drinks    Types: 1 Standard drinks or equivalent per week  . Drug use: No  . Sexual activity: Yes    Partners: Male  Other Topics Concern  . 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 She  exercises regularly 3-5 days per week. . 2 caffeinated drinks per day. No children.   Social Determinants of Health   Financial Resource Strain:   . Difficulty of Paying Living Expenses:   Food Insecurity:   . Worried About Programme researcher, broadcasting/film/video in the Last Year:   . Barista in the Last Year:   Transportation Needs:   . Freight forwarder (Medical):   Marland Kitchen Lack of Transportation (Non-Medical):   Physical Activity:   . Days of Exercise per Week:   . Minutes of Exercise per Session:   Stress:   . Feeling of Stress :   Social Connections:   . Frequency of Communication with Friends and Family:   . Frequency of Social Gatherings with Friends and Family:   . Attends Religious Services:   . Active Member of Clubs or Organizations:   . Attends Banker Meetings:   Marland Kitchen Marital Status:   Intimate Partner Violence:   . Fear of Current or Ex-Partner:   . Emotionally Abused:   Marland Kitchen Physically Abused:   . Sexually Abused:     Outpatient Medications Prior to Visit  Medication Sig Dispense Refill  . acyclovir (ZOVIRAX) 400 MG tablet TAKE 1 TABLET (400 MG TOTAL) BY MOUTH  3 (THREE) TIMES DAILY. 30 tablet PRN  . ALPRAZolam (XANAX) 0.5 MG tablet TAKE 1/2 TO 1 TABLET AS NEEDED ANXIETY 6 tablet 2  . atorvastatin (LIPITOR) 40 MG tablet TAKE 1 TABLET EVERY DAY 90 tablet 3  . calcium-vitamin D (OSCAL WITH D) 500-200 MG-UNIT per tablet Take 1 tablet by mouth daily.    . meloxicam (MOBIC) 15 MG tablet Take 1 tablet (15 mg total) by mouth daily as needed for pain. 30 tablet 2  . zolpidem (AMBIEN) 5 MG tablet Take 1 tablet (5 mg total) by mouth at bedtime as needed for sleep. 90 tablet 1   No facility-administered medications prior to visit.    Allergies  Allergen Reactions  . Zyrtec [Cetirizine] Other (See Comments)    Feels "strange" in her head.     ROS Review of Systems    Objective:    Physical Exam  Constitutional: She is oriented to person, place, and time. She  appears well-developed and well-nourished.  HENT:  Head: Normocephalic and atraumatic.  Cardiovascular: Normal rate, regular rhythm and normal heart sounds.  Pulmonary/Chest: Effort normal and breath sounds normal.  Neurological: She is alert and oriented to person, place, and time.  Skin: Skin is warm and dry.  Psychiatric: She has a normal mood and affect. Her behavior is normal.    BP 120/62   Pulse (!) 59   Ht 5\' 1"  (1.549 m)   Wt 112 lb (50.8 kg)   SpO2 100%   BMI 21.16 kg/m  Wt Readings from Last 3 Encounters:  06/23/19 112 lb (50.8 kg)  03/12/19 115 lb (52.2 kg)  12/23/18 115 lb (52.2 kg)     There are no preventive care reminders to display for this patient.  There are no preventive care reminders to display for this patient.  Lab Results  Component Value Date   TSH 1.93 12/16/2018   Lab Results  Component Value Date   WBC 4.5 12/16/2018   HGB 14.1 12/16/2018   HCT 40.7 12/16/2018   MCV 92.5 12/16/2018   PLT 223 12/16/2018   Lab Results  Component Value Date   NA 141 12/16/2018   K 4.6 12/16/2018   CO2 30 12/16/2018   GLUCOSE 92 12/16/2018   BUN 14 12/16/2018   CREATININE 0.71 12/16/2018   BILITOT 0.7 12/16/2018   ALKPHOS 60 05/21/2016   AST 17 12/16/2018   ALT 15 12/16/2018   PROT 6.4 12/16/2018   ALBUMIN 4.0 05/21/2016   CALCIUM 9.1 12/16/2018   Lab Results  Component Value Date   CHOL 155 12/16/2018   Lab Results  Component Value Date   HDL 57 12/16/2018   Lab Results  Component Value Date   LDLCALC 77 12/16/2018   Lab Results  Component Value Date   TRIG 119 12/16/2018   Lab Results  Component Value Date   CHOLHDL 2.7 12/16/2018   No results found for: HGBA1C    Assessment & Plan:   Problem List Items Addressed This Visit      Musculoskeletal and Integument   Lumbar degenerative disc disease - Primary    Doing well on Mobic as needed.  Did remind her to just use as needed she says she maybe takes it a couple times every  2 weeks to avoid any risk or insult to her renal function which has been normal.        Other   INSOMNIA    Will refill Ambien.  Taking nightly.  Tolerating well.  Relevant Medications   zolpidem (AMBIEN) 5 MG tablet      Meds ordered this encounter  Medications  . zolpidem (AMBIEN) 5 MG tablet    Sig: Take 1 tablet (5 mg total) by mouth at bedtime as needed for sleep.    Dispense:  90 tablet    Refill:  1    Follow-up: Return in about 6 months (around 12/23/2019) for Sleep and check labs.  Nani Gasser, MD

## 2019-06-23 NOTE — Assessment & Plan Note (Signed)
Will refill Ambien.  Taking nightly.  Tolerating well.

## 2019-07-24 ENCOUNTER — Encounter: Payer: Self-pay | Admitting: Family Medicine

## 2019-08-11 NOTE — Telephone Encounter (Signed)
To whom it may concern and at the [facility] [employer],   [name] has been a patient of mine for several years.  She has a diagnosis of insomnia as well as generalized anxiety disorder.  She is currently under my care for these conditions and is actually on prescription medication for these conditions.  Having a support animal has been extraordinarily helpful in helping her to manage her mental health condition and sleep disorder.  Her conditions do impair her ability to care for herself when it is not well controlled.  Please allow her to have a cat as an emotional support animal.  Her cat provides a sense of security and calm for her anxiety.    If we can provide any additional information or support then please let us know.    Letter completed.  See if pt needs faxed to a specific place, etc.

## 2019-09-04 ENCOUNTER — Encounter: Payer: Self-pay | Admitting: Family Medicine

## 2019-09-30 ENCOUNTER — Encounter: Payer: Self-pay | Admitting: *Deleted

## 2019-11-13 ENCOUNTER — Other Ambulatory Visit: Payer: Self-pay | Admitting: Family Medicine

## 2019-11-13 DIAGNOSIS — Z1231 Encounter for screening mammogram for malignant neoplasm of breast: Secondary | ICD-10-CM

## 2019-11-16 ENCOUNTER — Telehealth: Payer: Self-pay | Admitting: Family Medicine

## 2019-11-16 DIAGNOSIS — Z0000 Encounter for general adult medical examination without abnormal findings: Secondary | ICD-10-CM

## 2019-11-16 NOTE — Telephone Encounter (Signed)
Pt has a physical scheduled on October 27th and would like Lab Orders sent down a week before her appointment.  Thank You.

## 2019-11-17 ENCOUNTER — Other Ambulatory Visit: Payer: Self-pay | Admitting: *Deleted

## 2019-11-17 ENCOUNTER — Encounter: Payer: Self-pay | Admitting: *Deleted

## 2019-11-17 DIAGNOSIS — Z0000 Encounter for general adult medical examination without abnormal findings: Secondary | ICD-10-CM

## 2019-11-17 NOTE — Telephone Encounter (Signed)
Labs ordered. She can go at her convenience.  

## 2019-11-17 NOTE — Progress Notes (Signed)
Error

## 2019-11-18 ENCOUNTER — Other Ambulatory Visit: Payer: Self-pay | Admitting: Family Medicine

## 2019-11-18 NOTE — Telephone Encounter (Signed)
Patient has been made aware. No further questions at this time.  

## 2019-12-18 LAB — LIPID PANEL
Cholesterol: 174 mg/dL (ref <200)
HDL: 68 mg/dL (ref >=50)
LDL Cholesterol (Calc): 79 mg/dL (calc)
Non-HDL Cholesterol (Calc): 106 mg/dL (calc) (ref <130)
Total CHOL/HDL Ratio: 2.6 (calc) (ref <5.0)
Triglycerides: 172 mg/dL — ABNORMAL HIGH (ref <150)

## 2019-12-18 LAB — CBC
HCT: 41.1 % (ref 35.0–45.0)
Hemoglobin: 13.6 g/dL (ref 11.7–15.5)
MCH: 30.7 pg (ref 27.0–33.0)
MCHC: 33.1 g/dL (ref 32.0–36.0)
MCV: 92.8 fL (ref 80.0–100.0)
MPV: 10 fL (ref 7.5–12.5)
Platelets: 283 10*3/uL (ref 140–400)
RBC: 4.43 10*6/uL (ref 3.80–5.10)
RDW: 11.8 % (ref 11.0–15.0)
WBC: 4.2 10*3/uL (ref 3.8–10.8)

## 2019-12-18 LAB — COMPLETE METABOLIC PANEL WITH GFR
AG Ratio: 1.9 (calc) (ref 1.0–2.5)
ALT: 16 U/L (ref 6–29)
AST: 15 U/L (ref 10–35)
Albumin: 4.3 g/dL (ref 3.6–5.1)
Alkaline phosphatase (APISO): 71 U/L (ref 37–153)
BUN: 20 mg/dL (ref 7–25)
CO2: 30 mmol/L (ref 20–32)
Calcium: 9.3 mg/dL (ref 8.6–10.4)
Chloride: 103 mmol/L (ref 98–110)
Creat: 0.9 mg/dL (ref 0.50–0.99)
GFR, Est African American: 79 mL/min/{1.73_m2} (ref >=60)
GFR, Est Non African American: 68 mL/min/{1.73_m2} (ref >=60)
Globulin: 2.3 g/dL (calc) (ref 1.9–3.7)
Glucose, Bld: 95 mg/dL (ref 65–99)
Potassium: 4.8 mmol/L (ref 3.5–5.3)
Sodium: 141 mmol/L (ref 135–146)
Total Bilirubin: 0.5 mg/dL (ref 0.2–1.2)
Total Protein: 6.6 g/dL (ref 6.1–8.1)

## 2019-12-18 LAB — TSH: TSH: 1.78 mIU/L (ref 0.40–4.50)

## 2019-12-23 ENCOUNTER — Ambulatory Visit (INDEPENDENT_AMBULATORY_CARE_PROVIDER_SITE_OTHER): Payer: BLUE CROSS/BLUE SHIELD | Admitting: Family Medicine

## 2019-12-23 ENCOUNTER — Encounter: Payer: Self-pay | Admitting: Family Medicine

## 2019-12-23 ENCOUNTER — Ambulatory Visit (INDEPENDENT_AMBULATORY_CARE_PROVIDER_SITE_OTHER): Payer: BLUE CROSS/BLUE SHIELD

## 2019-12-23 ENCOUNTER — Other Ambulatory Visit: Payer: Self-pay

## 2019-12-23 VITALS — BP 128/78 | HR 72 | Ht 61.0 in | Wt 118.0 lb

## 2019-12-23 DIAGNOSIS — D72819 Decreased white blood cell count, unspecified: Secondary | ICD-10-CM | POA: Diagnosis not present

## 2019-12-23 DIAGNOSIS — Z1231 Encounter for screening mammogram for malignant neoplasm of breast: Secondary | ICD-10-CM

## 2019-12-23 DIAGNOSIS — F5101 Primary insomnia: Secondary | ICD-10-CM

## 2019-12-23 DIAGNOSIS — Z23 Encounter for immunization: Secondary | ICD-10-CM

## 2019-12-23 MED ORDER — MELOXICAM 15 MG PO TABS
15.0000 mg | ORAL_TABLET | Freq: Every day | ORAL | 1 refills | Status: DC | PRN
Start: 2019-12-23 — End: 2021-02-16

## 2019-12-23 MED ORDER — VALACYCLOVIR HCL 1 G PO TABS: 2000.0000 mg | ORAL_TABLET | Freq: Two times a day (BID) | ORAL | 2 refills | Status: DC

## 2019-12-23 MED ORDER — ZOLPIDEM TARTRATE 5 MG PO TABS: 5.0000 mg | ORAL_TABLET | Freq: Every evening | ORAL | 1 refills | Status: DC | PRN

## 2019-12-23 MED ORDER — ATORVASTATIN CALCIUM 40 MG PO TABS
40.0000 mg | ORAL_TABLET | Freq: Every day | ORAL | 3 refills | Status: DC
Start: 2019-12-23 — End: 2021-01-25

## 2019-12-23 NOTE — Patient Instructions (Signed)
Health Maintenance, Female Adopting a healthy lifestyle and getting preventive care are important in promoting health and wellness. Ask your health care provider about:  The right schedule for you to have regular tests and exams.  Things you can do on your own to prevent diseases and keep yourself healthy. What should I know about diet, weight, and exercise? Eat a healthy diet   Eat a diet that includes plenty of vegetables, fruits, low-fat dairy products, and lean protein.  Do not eat a lot of foods that are high in solid fats, added sugars, or sodium. Maintain a healthy weight Body mass index (BMI) is used to identify weight problems. It estimates body fat based on height and weight. Your health care provider can help determine your BMI and help you achieve or maintain a healthy weight. Get regular exercise Get regular exercise. This is one of the most important things you can do for your health. Most adults should:  Exercise for at least 150 minutes each week. The exercise should increase your heart rate and make you sweat (moderate-intensity exercise).  Do strengthening exercises at least twice a week. This is in addition to the moderate-intensity exercise.  Spend less time sitting. Even light physical activity can be beneficial. Watch cholesterol and blood lipids Have your blood tested for lipids and cholesterol at 63 years of age, then have this test every 5 years. Have your cholesterol levels checked more often if:  Your lipid or cholesterol levels are high.  You are older than 63 years of age.  You are at high risk for heart disease. What should I know about cancer screening? Depending on your health history and family history, you may need to have cancer screening at various ages. This may include screening for:  Breast cancer.  Cervical cancer.  Colorectal cancer.  Skin cancer.  Lung cancer. What should I know about heart disease, diabetes, and high blood  pressure? Blood pressure and heart disease  High blood pressure causes heart disease and increases the risk of stroke. This is more likely to develop in people who have high blood pressure readings, are of African descent, or are overweight.  Have your blood pressure checked: ? Every 3-5 years if you are 18-39 years of age. ? Every year if you are 40 years old or older. Diabetes Have regular diabetes screenings. This checks your fasting blood sugar level. Have the screening done:  Once every three years after age 40 if you are at a normal weight and have a low risk for diabetes.  More often and at a younger age if you are overweight or have a high risk for diabetes. What should I know about preventing infection? Hepatitis B If you have a higher risk for hepatitis B, you should be screened for this virus. Talk with your health care provider to find out if you are at risk for hepatitis B infection. Hepatitis C Testing is recommended for:  Everyone born from 1945 through 1965.  Anyone with known risk factors for hepatitis C. Sexually transmitted infections (STIs)  Get screened for STIs, including gonorrhea and chlamydia, if: ? You are sexually active and are younger than 63 years of age. ? You are older than 63 years of age and your health care provider tells you that you are at risk for this type of infection. ? Your sexual activity has changed since you were last screened, and you are at increased risk for chlamydia or gonorrhea. Ask your health care provider if   you are at risk.  Ask your health care provider about whether you are at high risk for HIV. Your health care provider may recommend a prescription medicine to help prevent HIV infection. If you choose to take medicine to prevent HIV, you should first get tested for HIV. You should then be tested every 3 months for as long as you are taking the medicine. Pregnancy  If you are about to stop having your period (premenopausal) and  you may become pregnant, seek counseling before you get pregnant.  Take 400 to 800 micrograms (mcg) of folic acid every day if you become pregnant.  Ask for birth control (contraception) if you want to prevent pregnancy. Osteoporosis and menopause Osteoporosis is a disease in which the bones lose minerals and strength with aging. This can result in bone fractures. If you are 65 years old or older, or if you are at risk for osteoporosis and fractures, ask your health care provider if you should:  Be screened for bone loss.  Take a calcium or vitamin D supplement to lower your risk of fractures.  Be given hormone replacement therapy (HRT) to treat symptoms of menopause. Follow these instructions at home: Lifestyle  Do not use any products that contain nicotine or tobacco, such as cigarettes, e-cigarettes, and chewing tobacco. If you need help quitting, ask your health care provider.  Do not use street drugs.  Do not share needles.  Ask your health care provider for help if you need support or information about quitting drugs. Alcohol use  Do not drink alcohol if: ? Your health care provider tells you not to drink. ? You are pregnant, may be pregnant, or are planning to become pregnant.  If you drink alcohol: ? Limit how much you use to 0-1 drink a day. ? Limit intake if you are breastfeeding.  Be aware of how much alcohol is in your drink. In the [place] one drink equals one 12 oz bottle of beer (355 mL), one 5 oz glass of wine (148 mL), or one 1 oz glass of hard liquor (44 mL). General instructions  Schedule regular health, dental, and eye exams.  Stay current with your vaccines.  Tell your health care provider if: ? You often feel depressed. ? You have ever been abused or do not feel safe at home. Summary  Adopting a healthy lifestyle and getting preventive care are important in promoting health and wellness.  Follow your health care provider's instructions about healthy  diet, exercising, and getting tested or screened for diseases.  Follow your health care provider's instructions on monitoring your cholesterol and blood pressure. This information is not intended to replace advice given to you by your health care provider. Make sure you discuss any questions you have with your health care provider. Document Revised: 02/05/2018 Document Reviewed: 02/05/2018 Elsevier Patient Education  2020 Elsevier Inc.  

## 2019-12-23 NOTE — Progress Notes (Signed)
Subjective:     Jordan Maynard is a 64 y.o. female and is here for a comprehensive physical exam. The patient reports no problems. mammo done this morning.      Social History   Socioeconomic History  . Marital status: Married    Spouse name: Jillyn Hidden   . Number of children: 0  . Years of education: Not on file  . Highest education level: Not on file  Occupational History  . Occupation: Camera operator: [employer]  Tobacco Use  . Smoking status: Former Smoker    Types: Cigarettes    Quit date: 02/26/2006    Years since quitting: 13.8  . Smokeless tobacco: Never Used  Vaping Use  . Vaping Use: Never used  Substance and Sexual Activity  . Alcohol use: Yes    Alcohol/week: 1.0 standard drink    Types: 1 Standard drinks or equivalent per week  . Drug use: No  . Sexual activity: Yes    Partners: Male  Other Topics Concern  . 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 She exercises regularly 3-5 days per week. . 2 caffeinated drinks per day. No children.   Social Determinants of Health   Financial Resource Strain:   . Difficulty of Paying Living Expenses: Not on file  Food Insecurity:   . Worried About Programme researcher, broadcasting/film/video in the Last Year: Not on file  . Ran Out of Food in the Last Year: Not on file  Transportation Needs:   . Lack of Transportation (Medical): Not on file  . Lack of Transportation (Non-Medical): Not on file  Physical Activity:   . Days of Exercise per Week: Not on file  . Minutes of Exercise per Session: Not on file  Stress:   . Feeling of Stress : Not on file  Social Connections:   . Frequency of Communication with Friends and Family: Not on file  . Frequency of Social Gatherings with Friends and Family: Not on file  . Attends Religious Services: Not on file  . Active Member of Clubs or Organizations: Not on file  . Attends Banker Meetings: Not on  file  . Marital Status: Not on file  Intimate Partner Violence:   . Fear of Current or Ex-Partner: Not on file  . Emotionally Abused: Not on file  . Physically Abused: Not on file  . Sexually Abused: Not on file   Health Maintenance  Topic Date Due  . TETANUS/TDAP  06/22/2020 (Originally 05/26/2019)  . PAP SMEAR-Modifier  11/29/2020  . MAMMOGRAM  12/16/2020  . DEXA SCAN  12/30/2021  . COLONOSCOPY  08/06/2023  . INFLUENZA VACCINE  Completed  . COVID-19 Vaccine  Completed  . Hepatitis C Screening  Completed  . HIV Screening  Completed    The following portions of the patient's history were reviewed and updated as appropriate: allergies, current medications, past family history, past medical history, past social history, past surgical history and problem list.  Review of Systems A comprehensive review of systems was negative.   Objective:    BP 128/78   Pulse 72   Ht 5\' 1"  (1.549 m)   Wt 118 lb (53.5 kg)   SpO2 97%   BMI 22.30 kg/m  General appearance: alert, cooperative and appears stated age Head: Normocephalic, without obvious abnormality, atraumatic Eyes: conj clear, EOMI, PEERLA Ears: normal TM's and external ear  canals both ears Nose: Nares normal. Septum midline. Mucosa normal. No drainage or sinus tenderness. Throat: lips, mucosa, and tongue normal; teeth and gums normal Neck: no adenopathy, no carotid bruit, no JVD, supple, symmetrical, trachea midline and thyroid not enlarged, symmetric, no tenderness/mass/nodules Back: symmetric, no curvature. ROM normal. No CVA tenderness. Lungs: clear to auscultation bilaterally Heart: regular rate and rhythm, S1, S2 normal, no murmur, click, rub or gallop Abdomen: soft, non-tender; bowel sounds normal; no masses,  no organomegaly Extremities: extremities normal, atraumatic, no cyanosis or edema Pulses: 2+ and symmetric Skin: Skin color, texture, turgor normal. No rashes or lesions Lymph nodes: Cervical, supraclavicular, and  axillary nodes normal. Neurologic: Alert and oriented X 3, normal strength and tone. Normal symmetric reflexes. Normal coordination and gait    Assessment:    Healthy female exam.      Plan:     See After Visit Summary for Counseling Recommendations   Keep up a regular exercise program and make sure you are eating a healthy diet Try to eat 4 servings of dairy a day, or if you are lactose intolerant take a calcium with vitamin D daily.  Your vaccines are up to date.  She is also due for Tdap but since she got her flu shot today wants to hold off onto her next appointment in 6 months.  She will be traveling out of the state for about 4 months so may need to contact us in December or January for repeat feels to be sent elsewhere.  For her oral herpes she would like to try valacyclovir instead of acyclovir so prescription changed and adjusted.  Also like a refill on her Mobic today as well as her zolpidem.  He is also a little bit concerned about the fact that her white blood count has been trending downward for a couple of years though it still technically in the normal range.  Recommend repeat CBC with differential when she returns from her trip in about 4 months.

## 2020-01-25 ENCOUNTER — Encounter: Payer: Self-pay | Admitting: Family Medicine

## 2020-06-23 ENCOUNTER — Encounter: Payer: Self-pay | Admitting: Family Medicine

## 2020-06-23 ENCOUNTER — Other Ambulatory Visit: Payer: Self-pay

## 2020-06-23 ENCOUNTER — Ambulatory Visit (INDEPENDENT_AMBULATORY_CARE_PROVIDER_SITE_OTHER): Payer: 59 | Admitting: Family Medicine

## 2020-06-23 VITALS — BP 126/72 | HR 78 | Ht 61.0 in | Wt 116.0 lb

## 2020-06-23 DIAGNOSIS — F5101 Primary insomnia: Secondary | ICD-10-CM | POA: Diagnosis not present

## 2020-06-23 DIAGNOSIS — E785 Hyperlipidemia, unspecified: Secondary | ICD-10-CM

## 2020-06-23 MED ORDER — ZOLPIDEM TARTRATE 5 MG PO TABS: 5.0000 mg | ORAL_TABLET | Freq: Every evening | ORAL | 1 refills | Status: DC | PRN

## 2020-06-23 NOTE — Assessment & Plan Note (Signed)
Tolerating Ambien well we did discuss maybe using a half a tab on nights where she is more tired or she thinks she might get a better night sleep and kind of just working at that to maybe have a few extra tabs at the end of each month to gradually work down off of the medication.  Did refill for the next 6 months.  I will see her in the fall for her regular routine physical does not look like she needs any updated blood work today.

## 2020-06-23 NOTE — Assessment & Plan Note (Signed)
Tolerating statin well.  Last LDL at 79.  Due for repeat in the fall.

## 2020-06-23 NOTE — Progress Notes (Signed)
Established Patient Office Visit  Subjective:  Patient ID: Jordan Maynard, female    DOB: 1955/04/07  Age: 65 y.o. MRN: [id number]  CC:  Chief Complaint  Patient presents with  . Insomnia    HPI Jordan Maynard presents for insomnia.  She is actually doing really well on her Ambien she takes 5 mg every night.  She has not had any major side effects she just notices that she is feeling a little bit more tired in the afternoons in general but she stays very active she plays tennis a lot she has been traveling back and forth to [place].  She is tolerating her statin without any side effects or problems.  Still uses the Mobic maybe once a week for her neck pain and says she feels like it actually works well she does not think she needs any refills right now she feels like she has plenty at home.  Past Medical History:  Diagnosis Date  . Hyperlipidemia   . Insomnia     Past Surgical History:  Procedure Laterality Date  . SEPTOPLASTY  May 2009    Family History  Problem Relation Age of Onset  . Skin cancer Father   . Hypertension Father   . Hypertension Mother     Social History   Socioeconomic History  . Marital status: Married    Spouse name: Jillyn Hidden   . Number of children: 0  . Years of education: Not on file  . Highest education level: Not on file  Occupational History  . Occupation: Camera operator: [employer]  Tobacco Use  . Smoking status: Former Smoker    Types: Cigarettes    Quit date: 02/26/2006    Years since quitting: 14.3  . Smokeless tobacco: Never Used  Vaping Use  . Vaping Use: Never used  Substance and Sexual Activity  . Alcohol use: Yes    Alcohol/week: 1.0 standard drink    Types: 1 Standard drinks or equivalent per week  . Drug use: No  . Sexual activity: Yes    Partners: Male  Other Topics Concern  . 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 She  exercises regularly 3-5 days per week. . 2 caffeinated drinks per day. No children.   Social Determinants of Health   Financial Resource Strain: Not on file  Food Insecurity: Not on file  Transportation Needs: Not on file  Physical Activity: Not on file  Stress: Not on file  Social Connections: Not on file  Intimate Partner Violence: Not on file    Outpatient Medications Prior to Visit  Medication Sig Dispense Refill  . ALPRAZolam (XANAX) 0.5 MG tablet TAKE 1/2 TO 1 TABLET AS NEEDED ANXIETY 6 tablet 2  . atorvastatin (LIPITOR) 40 MG tablet Take 1 tablet (40 mg total) by mouth daily. 90 tablet 3  . calcium-vitamin D (OSCAL WITH D) 500-200 MG-UNIT per tablet Take 1 tablet by mouth daily.    . meloxicam (MOBIC) 15 MG tablet Take 1 tablet (15 mg total) by mouth daily as needed for pain. 90 tablet 1  . valACYclovir (VALTREX) 1000 MG tablet Take 2 tablets (2,000 mg total) by mouth 2 (two) times daily. X 1 day PRN for breakout 30 tablet 2  . zolpidem (AMBIEN) 5 MG tablet Take 1 tablet (5 mg total) by mouth at bedtime as needed for sleep. 90 tablet 1  No facility-administered medications prior to visit.    Allergies  Allergen Reactions  . Zyrtec [Cetirizine] Other (See Comments)    Feels "strange" in her head.     ROS Review of Systems    Objective:    Physical Exam Constitutional:      Appearance: She is well-developed.  HENT:     Head: Normocephalic and atraumatic.  Cardiovascular:     Rate and Rhythm: Normal rate and regular rhythm.     Heart sounds: Normal heart sounds.  Pulmonary:     Effort: Pulmonary effort is normal.     Breath sounds: Normal breath sounds.  Skin:    General: Skin is warm and dry.  Neurological:     Mental Status: She is alert and oriented to person, place, and time.  Psychiatric:        Behavior: Behavior normal.     BP 126/72   Pulse 78   Ht 5\' 1"  (1.549 m)   Wt 116 lb (52.6 kg)   SpO2 99%   BMI 21.92 kg/m  Wt Readings from Last 3  Encounters:  06/23/20 116 lb (52.6 kg)  12/23/19 118 lb (53.5 kg)  06/23/19 112 lb (50.8 kg)     Health Maintenance Due  Topic Date Due  . TETANUS/TDAP  05/26/2019  . COVID-19 Vaccine (3 - Booster for Pfizer series) 11/19/2019    There are no preventive care reminders to display for this patient.  Lab Results  Component Value Date   TSH 1.78 12/16/2019   Lab Results  Component Value Date   WBC 4.2 12/16/2019   HGB 13.6 12/16/2019   HCT 41.1 12/16/2019   MCV 92.8 12/16/2019   PLT 283 12/16/2019   Lab Results  Component Value Date   NA 141 12/16/2019   K 4.8 12/16/2019   CO2 30 12/16/2019   GLUCOSE 95 12/16/2019   BUN 20 12/16/2019   CREATININE 0.90 12/16/2019   BILITOT 0.5 12/16/2019   ALKPHOS 60 05/21/2016   AST 15 12/16/2019   ALT 16 12/16/2019   PROT 6.6 12/16/2019   ALBUMIN 4.0 05/21/2016   CALCIUM 9.3 12/16/2019   Lab Results  Component Value Date   CHOL 174 12/16/2019   Lab Results  Component Value Date   HDL 68 12/16/2019   Lab Results  Component Value Date   LDLCALC 79 12/16/2019   Lab Results  Component Value Date   TRIG 172 (H) 12/16/2019   Lab Results  Component Value Date   CHOLHDL 2.6 12/16/2019   No results found for: HGBA1C    Assessment & Plan:   Problem List Items Addressed This Visit      Other   INSOMNIA - Primary    Tolerating Ambien well we did discuss maybe using a half a tab on nights where she is more tired or she thinks she might get a better night sleep and kind of just working at that to maybe have a few extra tabs at the end of each month to gradually work down off of the medication.  Did refill for the next 6 months.  I will see her in the fall for her regular routine physical does not look like she needs any updated blood work today.      Relevant Medications   zolpidem (AMBIEN) 5 MG tablet   Hyperlipidemia    Tolerating statin well.  Last LDL at 79.  Due for repeat in the fall.  Meds ordered this  encounter  Medications  . zolpidem (AMBIEN) 5 MG tablet    Sig: Take 1 tablet (5 mg total) by mouth at bedtime as needed for sleep.    Dispense:  90 tablet    Refill:  1    Follow-up: No follow-ups on file.   I spent 20 minutes on the day of the encounter to include pre-visit record review, face-to-face time with the patient and post visit ordering of test.   Nani Gasser, MD

## 2020-11-24 ENCOUNTER — Encounter: Payer: 59 | Admitting: Family Medicine

## 2020-11-25 ENCOUNTER — Ambulatory Visit (INDEPENDENT_AMBULATORY_CARE_PROVIDER_SITE_OTHER): Payer: 59 | Admitting: Family Medicine

## 2020-11-25 ENCOUNTER — Other Ambulatory Visit: Payer: Self-pay

## 2020-11-25 ENCOUNTER — Encounter: Payer: Self-pay | Admitting: Family Medicine

## 2020-11-25 ENCOUNTER — Other Ambulatory Visit (HOSPITAL_COMMUNITY)
Admission: RE | Admit: 2020-11-25 | Discharge: 2020-11-25 | Disposition: A | Discharge status: Home / Self Care | Payer: 59 | Source: Ambulatory Visit | Attending: Family Medicine | Admitting: Family Medicine

## 2020-11-25 VITALS — BP 122/65 | HR 69 | Ht 61.0 in | Wt 117.0 lb

## 2020-11-25 DIAGNOSIS — Z124 Encounter for screening for malignant neoplasm of cervix: Secondary | ICD-10-CM | POA: Diagnosis not present

## 2020-11-25 DIAGNOSIS — Z13220 Encounter for screening for lipoid disorders: Secondary | ICD-10-CM | POA: Diagnosis not present

## 2020-11-25 DIAGNOSIS — Z1329 Encounter for screening for other suspected endocrine disorder: Secondary | ICD-10-CM

## 2020-11-25 DIAGNOSIS — Z0000 Encounter for general adult medical examination without abnormal findings: Secondary | ICD-10-CM

## 2020-11-25 DIAGNOSIS — Z1231 Encounter for screening mammogram for malignant neoplasm of breast: Secondary | ICD-10-CM

## 2020-11-25 DIAGNOSIS — Z23 Encounter for immunization: Secondary | ICD-10-CM

## 2020-11-25 MED ORDER — ROPINIROLE HCL 0.25 MG PO TABS: 0.2500 mg | ORAL_TABLET | Freq: Every day | ORAL | 0 refills | Status: DC

## 2020-11-25 NOTE — Patient Instructions (Signed)
Tdap (Tetanus, Diphtheria, Pertussis) Vaccine: What You Need to Know 1. Why get vaccinated? Tdap vaccine can prevent tetanus, diphtheria, and pertussis. Diphtheria and pertussis spread from person to person. Tetanus enters the body through cuts or wounds. TETANUS (T) causes painful stiffening of the muscles. Tetanus can lead to serious health problems, including being unable to open the mouth, having trouble swallowing and breathing, or death. DIPHTHERIA (D) can lead to difficulty breathing, heart failure, paralysis, or death. PERTUSSIS (aP), also known as "whooping cough," can cause uncontrollable, violent coughing that makes it hard to breathe, eat, or drink. Pertussis can be extremely serious especially in babies and young children, causing pneumonia, convulsions, brain damage, or death. In teens and adults, it can cause weight loss, loss of bladder control, passing out, and rib fractures from severe coughing. 2. Tdap vaccine Tdap is only for children 7 years and older, adolescents, and adults.  Adolescents should receive a single dose of Tdap, preferably at age 11 or 12 years. Pregnant people should get a dose of Tdap during every pregnancy, preferably during the early part of the third trimester, to help protect the newborn from pertussis. Infants are most at risk for severe, life-threatening complications frompertussis. Adults who have never received Tdap should get a dose of Tdap. Also, adults should receive a booster dose of either Tdap or Td (a different vaccine that protects against tetanus and diphtheria but not pertussis) every 10 years, or after 5 years in the case of a severe or dirty wound or burn. Tdap may be given at the same time as other vaccines. 3. Talk with your health care provider Tell your vaccine provider if the person getting the vaccine: Has had an allergic reaction after a previous dose of any vaccine that protects against tetanus, diphtheria, or pertussis, or has any  severe, life-threatening allergies Has had a coma, decreased level of consciousness, or prolonged seizures within 7 days after a previous dose of any pertussis vaccine (DTP, DTaP, or Tdap) Has seizures or another nervous system problem Has ever had Guillain-Barr Syndrome (also called "GBS") Has had severe pain or swelling after a previous dose of any vaccine that protects against tetanus or diphtheria In some cases, your health care provider may decide to postpone Tdapvaccination until a future visit. People with minor illnesses, such as a cold, may be vaccinated. People who are moderately or severely ill should usually wait until they recover beforegetting Tdap vaccine.  Your health care provider can give you more information. 4. Risks of a vaccine reaction Pain, redness, or swelling where the shot was given, mild fever, headache, feeling tired, and nausea, vomiting, diarrhea, or stomachache sometimes happen after Tdap vaccination. People sometimes faint after medical procedures, including vaccination. Tellyour provider if you feel dizzy or have vision changes or ringing in the ears.  As with any medicine, there is a very remote chance of a vaccine causing asevere allergic reaction, other serious injury, or death. 5. What if there is a serious problem? An allergic reaction could occur after the vaccinated person leaves the clinic. If you see signs of a severe allergic reaction (hives, swelling of the face and throat, difficulty breathing, a fast heartbeat, dizziness, or weakness), call 9-1-1and get the person to the nearest hospital. For other signs that concern you, call your health care provider.  Adverse reactions should be reported to the Vaccine Adverse Event Reporting System (VAERS). Your health care provider will usually file this report, or you can do it yourself. Visit the   VAERS website at www.vaers.hhs.gov or call 1-800-822-7967. VAERS is only for reporting reactions, and VAERS staff  members do not give medical advice. 6. The National Vaccine Injury Compensation Program The National Vaccine Injury Compensation Program (VICP) is a federal program that was created to compensate people who may have been injured by certain vaccines. Claims regarding alleged injury or death due to vaccination have a time limit for filing, which may be as short as two years. Visit the VICP website at www.hrsa.gov/vaccinecompensation or call 1-800-338-2382to learn about the program and about filing a claim. 7. How can I learn more? Ask your health care provider. Call your local or state health department. Visit the website of the Food and Drug Administration (FDA) for vaccine package inserts and additional information at www.fda.gov/vaccines-blood-biologics/vaccines. Contact the Centers for Disease Control and Prevention (CDC): Call 1-800-232-4636 (1-800-CDC-INFO) or Visit CDC's website at www.cdc.gov/vaccines. Vaccine Information Statement Tdap (Tetanus, Diphtheria, Pertussis) Vaccine(10/02/2019) This information is not intended to replace advice given to you by your health care provider. Make sure you discuss any questions you have with your healthcare provider. Document Revised: 10/28/2019 Document Reviewed: 10/28/2019 Elsevier Patient Education  2022 Elsevier Inc.  

## 2020-11-25 NOTE — Progress Notes (Signed)
Subjective:     Jordan Maynard is a 65 y.o. female and is here for a comprehensive physical exam. The patient reports no problems.she is still very active playing tennis and running.  She is in the top for her age group.  ROS is neg.     Social History   Socioeconomic History   Marital status: Married    Spouse name: Jillyn Hidden    Number of children: 0   Years of education: Not on file   Highest education level: Not on file  Occupational History   Occupation: Camera operator: [employer]  Tobacco Use   Smoking status: Former    Types: Cigarettes    Quit date: 02/26/2006    Years since quitting: 14.7   Smokeless tobacco: Never  Vaping Use   Vaping Use: Never used  Substance and Sexual Activity   Alcohol use: Yes    Alcohol/week: 1.0 standard drink    Types: 1 Standard drinks or equivalent per week   Drug use: No   Sexual activity: Yes    Partners: Male  Other Topics Concern   Not on file  Social History Narrative   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 She exercises regularly 3-5 days per week. . 2 caffeinated drinks per day. No children.   Social Determinants of Health   Financial Resource Strain: Not on file  Food Insecurity: Not on file  Transportation Needs: Not on file  Physical Activity: Not on file  Stress: Not on file  Social Connections: Not on file  Intimate Partner Violence: Not on file   Health Maintenance  Topic Date Due   PAP SMEAR-Modifier  11/29/2020   COVID-19 Vaccine (4 - Booster for Pfizer series) 12/11/2020 (Originally 06/17/2020)   INFLUENZA VACCINE  05/26/2021 (Originally 09/26/2020)   MAMMOGRAM  12/22/2021   DEXA SCAN  12/30/2021   COLONOSCOPY (Pts 45-72yrs Insurance coverage will need to be confirmed)  08/06/2023   TETANUS/TDAP  11/26/2030   Hepatitis C Screening  Completed   HIV Screening  Completed   Zoster Vaccines- Shingrix  Completed   HPV VACCINES  Aged Out    The following  portions of the patient's history were reviewed and updated as appropriate: allergies, current medications, past family history, past medical history, past social history, past surgical history, and problem list.  Review of Systems Pertinent items are noted in HPI.   Objective:    BP 122/65   Pulse 69   Ht 5\' 1"  (1.549 m)   Wt 117 lb (53.1 kg)   SpO2 100%   BMI 22.11 kg/m     Physical Exam Vitals reviewed. Exam conducted with a chaperone present.  Constitutional:      Appearance: She is well-developed.  HENT:     Head: Normocephalic and atraumatic.     Right Ear: External ear normal.     Left Ear: External ear normal.     Nose: Nose normal.  Eyes:     Conjunctiva/sclera: Conjunctivae normal.     Pupils: Pupils are equal, round, and reactive to light.  Neck:     Thyroid: No thyromegaly.  Cardiovascular:     Rate and Rhythm: Normal rate and regular rhythm.     Heart sounds: Normal heart sounds.  Pulmonary:     Effort: Pulmonary effort is normal.     Breath sounds: Normal breath sounds. No wheezing.  Genitourinary:    General: Normal vulva.  Vagina: Normal.     Cervix: Friability present.     Uterus: Normal.      Adnexa: Right adnexa normal and left adnexa normal.     Rectum: Normal.     Comments: Cervix stenotic Musculoskeletal:     Cervical back: Neck supple.  Lymphadenopathy:     Cervical: No cervical adenopathy.  Skin:    General: Skin is warm and dry.     Coloration: Skin is not pale.  Neurological:     Mental Status: She is alert and oriented to person, place, and time.  Psychiatric:        Behavior: Behavior normal.      Assessment:    Healthy female exam.      Plan:     See After Visit Summary for Counseling Recommendations   Keep up a regular exercise program and make sure you are eating a healthy diet Try to eat 4 servings of dairy a day, or if you are lactose intolerant take a calcium with vitamin D daily.  Your vaccines are up to date.  Tdap given today.  She will get the flu shot in [facility].  Pap smear performed. Labs ordered.

## 2020-11-29 LAB — CBC WITH DIFFERENTIAL/PLATELET
Absolute Monocytes: 562 cells/uL (ref 200–950)
Basophils Absolute: 29 cells/uL (ref 0–200)
Basophils Relative: 0.6 %
Eosinophils Absolute: 48 cells/uL (ref 15–500)
Eosinophils Relative: 1 %
HCT: 41.9 % (ref 35.0–45.0)
Hemoglobin: 14.5 g/dL (ref 11.7–15.5)
Lymphs Abs: 1992 cells/uL (ref 850–3900)
MCH: 32.4 pg (ref 27.0–33.0)
MCHC: 34.6 g/dL (ref 32.0–36.0)
MCV: 93.7 fL (ref 80.0–100.0)
MPV: 10.1 fL (ref 7.5–12.5)
Monocytes Relative: 11.7 %
Neutro Abs: 2170 cells/uL (ref 1500–7800)
Neutrophils Relative %: 45.2 %
Platelets: 226 10*3/uL (ref 140–400)
RBC: 4.47 10*6/uL (ref 3.80–5.10)
RDW: 12.1 % (ref 11.0–15.0)
Total Lymphocyte: 41.5 %
WBC: 4.8 10*3/uL (ref 3.8–10.8)

## 2020-11-29 LAB — COMPLETE METABOLIC PANEL WITH GFR
AG Ratio: 2.1 (calc) (ref 1.0–2.5)
ALT: 15 U/L (ref 6–29)
AST: 14 U/L (ref 10–35)
Albumin: 4.2 g/dL (ref 3.6–5.1)
Alkaline phosphatase (APISO): 75 U/L (ref 37–153)
BUN: 13 mg/dL (ref 7–25)
CO2: 31 mmol/L (ref 20–32)
Calcium: 9.4 mg/dL (ref 8.6–10.4)
Chloride: 103 mmol/L (ref 98–110)
Creat: 0.65 mg/dL (ref 0.50–1.05)
Globulin: 2 g/dL (calc) (ref 1.9–3.7)
Glucose, Bld: 88 mg/dL (ref 65–99)
Potassium: 4.4 mmol/L (ref 3.5–5.3)
Sodium: 141 mmol/L (ref 135–146)
Total Bilirubin: 0.4 mg/dL (ref 0.2–1.2)
Total Protein: 6.2 g/dL (ref 6.1–8.1)
eGFR: 98 mL/min/{1.73_m2} (ref >=60)

## 2020-11-29 LAB — LIPID PANEL
Cholesterol: 178 mg/dL (ref <200)
HDL: 59 mg/dL (ref >=50)
LDL Cholesterol (Calc): 92 mg/dL (calc)
Non-HDL Cholesterol (Calc): 119 mg/dL (calc) (ref <130)
Total CHOL/HDL Ratio: 3 (calc) (ref <5.0)
Triglycerides: 170 mg/dL — ABNORMAL HIGH (ref <150)

## 2020-11-29 LAB — TSH: TSH: 2.58 mIU/L (ref 0.40–4.50)

## 2020-11-29 LAB — CYTOLOGY - PAP
Comment: NEGATIVE
Diagnosis: NEGATIVE
High risk HPV: NEGATIVE

## 2020-11-30 NOTE — Progress Notes (Signed)
Hi Jordan Maynard, it was great to see you the other day.  Your blood count is normal.  Kidney and liver function look great.  Total cholesterol and LDL look great.  Triglycerides mildly elevated but similar to last year.  Thyroid looks great.  Also your Pap smear is normal.  Will not need another Pap smear unless you are having problems or concerns.

## 2020-12-18 ENCOUNTER — Other Ambulatory Visit: Payer: Self-pay | Admitting: Family Medicine

## 2021-01-25 ENCOUNTER — Other Ambulatory Visit: Payer: Self-pay | Admitting: Family Medicine

## 2021-02-16 ENCOUNTER — Encounter: Payer: Self-pay | Admitting: Family Medicine

## 2021-02-16 MED ORDER — MELOXICAM 15 MG PO TABS: 15.0000 mg | ORAL_TABLET | Freq: Every day | ORAL | 1 refills | Status: DC | PRN

## 2021-03-05 ENCOUNTER — Other Ambulatory Visit: Payer: Self-pay | Admitting: Family Medicine

## 2021-07-06 ENCOUNTER — Ambulatory Visit (INDEPENDENT_AMBULATORY_CARE_PROVIDER_SITE_OTHER): Payer: Medicare HMO

## 2021-07-06 DIAGNOSIS — Z1231 Encounter for screening mammogram for malignant neoplasm of breast: Secondary | ICD-10-CM | POA: Diagnosis not present

## 2021-07-28 ENCOUNTER — Encounter: Payer: Self-pay | Admitting: Family Medicine

## 2021-07-31 NOTE — Telephone Encounter (Signed)
Looks like she is scheduled for physical but the last time we did blood work was in the fall when she had her physical in September.  Did she change insurances?  I just want to make sure that they are not going to penalize her for having a physical in less than 365 days.  If they are okay with that then you can just order the same labs that were ordered last fall.

## 2021-08-02 ENCOUNTER — Encounter: Payer: Self-pay | Admitting: Family Medicine

## 2021-08-02 ENCOUNTER — Ambulatory Visit (INDEPENDENT_AMBULATORY_CARE_PROVIDER_SITE_OTHER): Payer: Medicare HMO | Admitting: Family Medicine

## 2021-08-02 VITALS — BP 123/65 | HR 60 | Resp 16 | Ht 61.0 in | Wt 123.0 lb

## 2021-08-02 DIAGNOSIS — G2581 Restless legs syndrome: Secondary | ICD-10-CM | POA: Diagnosis not present

## 2021-08-02 DIAGNOSIS — F5101 Primary insomnia: Secondary | ICD-10-CM | POA: Diagnosis not present

## 2021-08-02 DIAGNOSIS — Z7689 Persons encountering health services in other specified circumstances: Secondary | ICD-10-CM | POA: Insufficient documentation

## 2021-08-02 MED ORDER — BELSOMRA 10 MG PO TABS: 10.0000 mg | ORAL_TABLET | Freq: Every evening | ORAL | 1 refills | Status: DC | PRN

## 2021-08-02 MED ORDER — ROPINIROLE HCL 0.5 MG PO TABS: 0.5000 mg | ORAL_TABLET | Freq: Every day | ORAL | 1 refills | Status: DC

## 2021-08-02 NOTE — Assessment & Plan Note (Addendum)
She is currently frustrated with her weight even though she is exercising regularly and really eating a pretty healthy clean diet overall.  She is also limiting calories to 1500 mg a day.  BMI is 23.  We discussed using her current app that she is using to set a goal of maybe 1/4 pound a week so that way it is just a reasonable caloric reduction in addition to her exercise making sure she is getting adequate protein.  And seeing if that is helpful or not.  We discussed the option of referring her to a clinician who is board-certified in weight loss medicine as well as I do think this could be helpful for her.  She really is overall doing a fantastic job so I suspect it is really just minutia at this point to get that weight down. She questions whether or not some of this could be hormone related.  I suspect that it is probably not most women start experience weight gain as they go through menopause in her early 38s.  We can certainly check hormone levels including her thyroid.

## 2021-08-02 NOTE — Progress Notes (Signed)
Established Patient Office Visit  Subjective   Patient ID: Jordan Maynard, female    DOB: [birth year]  Age: 66 y.o. MRN: [id number]  Chief Complaint  Patient presents with   Insomnia    Several years. Patient has been off of Ambien for 6 months and would like to discuss other medications.    Weight Management     Patient would like to discuss options to help lose weight.    HPI  Follow-up insomnia-she has had insomnia issues for years.  She is still having difficulty falling asleep and staying asleep.  She had tapered off of the Ambien about 6 months ago because of contraindication and risk for her age.  But she is at the point where she needs to try something that might be helpful for her.  She would also like to discuss her weight today.  She says she exercises very consistently about 5 days a week.  Mostly focusing on playing tennis, running and swimming.  She eats around 1500 cal a day and just cannot seem to lose weight around her middle.  She was at a good weight she feels like back in 2021.  She has been tracking her macros.  She has been eating a lot of oatmeal nuts and grains.  She really feels like for the efforts she is putting and she is not really seeing any change in her weight.  Restless leg symptoms-she has had to go up to 2 tabs recently to get better control of her leg symptoms.    ROS    Objective:     BP 123/65   Pulse 60   Resp 16   Ht 5\' 1"  (1.549 m)   Wt 123 lb (55.8 kg)   SpO2 95%   BMI 23.24 kg/m    Physical Exam   No results found for any visits on 08/02/21.    The 10-year ASCVD risk score (Arnett DK, et al., 2019) is: 4.7%    Assessment & Plan:   Problem List Items Addressed This Visit       Other   RLS (restless legs syndrome)    Will increase tab from 0.25 to 0.5 mg.  She can split the dose if needed from time to time.       Relevant Medications   rOPINIRole (REQUIP) 0.5 MG tablet   INSOMNIA - Primary    This.  Like to try  Belsomra it looks like her insurance will cover it has a much cleaner side effect profile it can take a couple of weeks to really build up.  And then we can always adjust her dose if needed upward.       Relevant Medications   Suvorexant (BELSOMRA) 10 MG TABS   Encounter for weight management    She is currently frustrated with her weight even though she is exercising regularly and really eating a pretty healthy clean diet overall.  She is also limiting calories to 1500 mg a day.  BMI is 23.  We discussed using her current app that she is using to set a goal of maybe 1/4 pound a week so that way it is just a reasonable caloric reduction in addition to her exercise making sure she is getting adequate protein.  And seeing if that is helpful or not.  We discussed the option of referring her to a clinician who is board-certified in weight loss medicine as well as I do think this could be helpful for her.  She  really is overall doing a fantastic job so I suspect it is really just minutia at this point to get that weight down. She questions whether or not some of this could be hormone related.  I suspect that it is probably not most women start experience weight gain as they go through menopause in her early 53s.  We can certainly check hormone levels including her thyroid.       Relevant Orders   Estradiol   Progesterone   TSH   Follicle stimulating hormone   Luteinizing hormone    Return in about 2 months (around 10/02/2021) for Sleep.    Nani Gasser, MD

## 2021-08-02 NOTE — Assessment & Plan Note (Signed)
This.  Like to try Belsomra it looks like her insurance will cover it has a much cleaner side effect profile it can take a couple of weeks to really build up.  And then we can always adjust her dose if needed upward.

## 2021-08-02 NOTE — Progress Notes (Deleted)
Complete physical exam  Patient: Jordan Maynard   DOB: 13-Oct-1955   66 y.o. Female  MRN: [id number]  Subjective:    No chief complaint on file.   Jordan Maynard is a 66 y.o. female who presents today for a complete physical exam. She reports consuming a {diet types:17450} diet. {types:19826} She generally feels {DESC; WELL/FAIRLY WELL/POORLY:18703}. She reports sleeping {DESC; WELL/FAIRLY WELL/POORLY:18703}. She {does/does not:200015} have additional problems to discuss today.    Most recent fall risk assessment:    11/25/2020    4:06 PM  Jim Falls in the past year? 0  Number falls in past yr: 0  Injury with Fall? 0  Follow up Falls evaluation completed     Most recent depression screenings:    11/25/2020    4:06 PM 06/23/2020    1:09 PM  PHQ 2/9 Scores  PHQ - 2 Score 0 0    {VISON DENTAL STD PSA (Optional):27386}  {History (Optional):23778}  Patient Care Team: Hali Marry, MD as PCP - Avel Sensor, MD as Referring Physician (Otolaryngology)   Outpatient Medications Prior to Visit  Medication Sig   ALPRAZolam (XANAX) 0.5 MG tablet TAKE 1/2 TO 1 TABLET AS NEEDED ANXIETY   atorvastatin (LIPITOR) 40 MG tablet TAKE 1 TABLET BY MOUTH EVERY DAY   calcium-vitamin D (OSCAL WITH D) 500-200 MG-UNIT per tablet Take 1 tablet by mouth daily.   meloxicam (MOBIC) 15 MG tablet Take 1 tablet (15 mg total) by mouth daily as needed for pain.   rOPINIRole (REQUIP) 0.25 MG tablet TAKE 1 TABLET BY MOUTH EVERYDAY AT BEDTIME   valACYclovir (VALTREX) 1000 MG tablet Take 2 tablets (2,000 mg total) by mouth 2 (two) times daily. X 1 day PRN for breakout   [DISCONTINUED] valACYclovir (VALTREX) 500 MG tablet Take 500 mg by mouth 2 (two) times daily.   [DISCONTINUED] zolpidem (AMBIEN) 5 MG tablet Take 1 tablet (5 mg total) by mouth at bedtime as needed for sleep.   No facility-administered medications prior to visit.    ROS        Objective:     There were no vitals  taken for this visit. {Vitals History (Optional):23777}  Physical Exam   No results found for any visits on 08/02/21. {Show previous labs (optional):23779}    Assessment & Plan:    Routine Health Maintenance and Physical Exam  Immunization History  Administered Date(s) Administered   Influenza,inj,Quad PF,6+ Mos 12/23/2018, 12/23/2019   Moderna Sars-Covid-2 Vaccination 02/17/2020   PFIZER(Purple Top)SARS-COV-2 Vaccination 04/23/2019, 05/19/2019   Td 02/27/1999, 05/25/2009   Tdap 11/25/2020   Zoster Recombinat (Shingrix) 05/05/2018, 07/10/2018    Health Maintenance  Topic Date Due   COVID-19 Vaccine (4 - Booster for Pfizer series) 04/13/2020   Pneumonia Vaccine 50+ Years old (1 - PCV) Never done   INFLUENZA VACCINE  09/26/2021   DEXA SCAN  12/30/2021   MAMMOGRAM  07/07/2023   COLONOSCOPY (Pts 45-36yrs Insurance coverage will need to be confirmed)  08/06/2023   PAP SMEAR-Modifier  11/25/2025   TETANUS/TDAP  11/26/2030   Hepatitis C Screening  Completed   HIV Screening  Completed   Zoster Vaccines- Shingrix  Completed   HPV VACCINES  Aged Out    Discussed health benefits of physical activity, and encouraged her to engage in regular exercise appropriate for her age and condition.  Problem List Items Addressed This Visit   None  No follow-ups on file.     Beatrice Lecher, MD

## 2021-08-02 NOTE — Assessment & Plan Note (Signed)
Will increase tab from 0.25 to 0.5 mg.  She can split the dose if needed from time to time.

## 2021-08-03 LAB — TSH: TSH: 1.85 mIU/L (ref 0.40–4.50)

## 2021-08-03 LAB — PROGESTERONE: Progesterone: <0.5 ng/mL

## 2021-08-03 LAB — LUTEINIZING HORMONE: LH: 25.7 m[IU]/mL

## 2021-08-03 LAB — ESTRADIOL: Estradiol: <15 pg/mL

## 2021-08-03 LAB — FOLLICLE STIMULATING HORMONE: FSH: 59.1 m[IU]/mL

## 2021-08-03 NOTE — Progress Notes (Signed)
Jordan Maynard, female hormones are consistent with being postmenopausal.  So nothing unusual there.  Thyroid level is normal.  One of the things that I forgot to mention to you is to make sure that you are doing some resistance training as part of your workout routine.  There was a good study that came out of [facility] several years ago looking at weight loss particularly around the abdomen and adding resistance training or weights to the routine was actually more helpful than just doing cardio.

## 2021-08-15 ENCOUNTER — Encounter: Payer: Self-pay | Admitting: Family Medicine

## 2021-08-15 DIAGNOSIS — M8580 Other specified disorders of bone density and structure, unspecified site: Secondary | ICD-10-CM

## 2021-08-15 NOTE — Telephone Encounter (Signed)
Bone density referral pended.  

## 2021-08-23 ENCOUNTER — Ambulatory Visit (INDEPENDENT_AMBULATORY_CARE_PROVIDER_SITE_OTHER): Payer: Medicare HMO

## 2021-08-23 DIAGNOSIS — M8580 Other specified disorders of bone density and structure, unspecified site: Secondary | ICD-10-CM | POA: Diagnosis not present

## 2021-08-23 NOTE — Progress Notes (Signed)
Hi Jordan Maynard, I am just reviewed your bone density results.  Your T score is -2.2 which puts you into the category of osteopenia, which is mildly thin bones.   The current recommendation for osteopenia (mildly thin bones) treatment includes:   #1 calcium-total of 1200 mg of calcium daily.  If you eat a very calcium rich diet you may be able to obtain that without a supplement.  If not, then I recommend calcium 500 mg twice a day.  There are several products over-the-counter such as Caltrate D and Viactiv chews which are great options that contain calcium and vitamin D. #2 vitamin D-recommend 800 international units daily. #3 exercise-recommend 30 minutes of weightbearing exercise 3 days a week.  Resistance training ,such as doing bands and light weights, can be particularly helpful.

## 2021-09-14 ENCOUNTER — Other Ambulatory Visit: Payer: Self-pay | Admitting: Family Medicine

## 2021-10-03 ENCOUNTER — Ambulatory Visit (INDEPENDENT_AMBULATORY_CARE_PROVIDER_SITE_OTHER): Payer: Medicare HMO | Admitting: Family Medicine

## 2021-10-03 ENCOUNTER — Encounter: Payer: Self-pay | Admitting: Family Medicine

## 2021-10-03 VITALS — BP 137/77 | HR 65 | Ht 61.0 in | Wt 123.0 lb

## 2021-10-03 DIAGNOSIS — F5101 Primary insomnia: Secondary | ICD-10-CM

## 2021-10-03 DIAGNOSIS — R002 Palpitations: Secondary | ICD-10-CM | POA: Diagnosis not present

## 2021-10-03 DIAGNOSIS — Z7689 Persons encountering health services in other specified circumstances: Secondary | ICD-10-CM | POA: Diagnosis not present

## 2021-10-03 MED ORDER — DAYVIGO 5 MG PO TABS: 5.0000 mg | ORAL_TABLET | Freq: Every day | ORAL | 1 refills | Status: DC

## 2021-10-03 NOTE — Assessment & Plan Note (Signed)
Since including going up on the Belsomra versus switching to Dayvigo.  It looks like Jordan Maynard is actually covered on her insurance plan so we will go ahead and switch.  Will start with 5 mg.  30-day supply sent to pharmacy with refills.

## 2021-10-03 NOTE — Progress Notes (Signed)
Established Patient Office Visit  Subjective   Patient ID: Jordan Maynard, female    DOB: [birth year]  Age: 66 y.o. MRN: [id number]  Chief Complaint  Patient presents with   Follow-up    Pt c/o sleep medications and unexplained weight gain.    HPI   F/U insomnia -she did try the Belsomra 10 mg and just felt like it really was not effective she would still wake up multiple times a night that was getting close to 7 hours of sleep total.  She also felt like she was waking up a little foggy.  She tried a friend's Dayvigo 5 mg dose and says that she actually felt like she slept well and woke up feeling very refreshed.  She is also never tried [place] in the past.  C/o palpations for a couple of months.  She is noticing that almost once a day.  They just last for a few seconds at a time and they can happen anytime they are not necessarily triggered by exercise or activity level.  He is also been really extremely frustrated with her attempts at weight loss.  She said she calorie restricted to about 1300 cal for 7 weeks and she already exercises regularly and did not lose any weight.  She just feels really frustrated.  Lab Results  Component Value Date   TSH 1.85 08/02/2021       ROS    Objective:     BP 137/77   Pulse 65   Ht 5\' 1"  (1.549 m)   Wt 123 lb (55.8 kg)   SpO2 99%   BMI 23.24 kg/m    Physical Exam Vitals and nursing note reviewed.  Constitutional:      Appearance: She is well-developed.  HENT:     Head: Normocephalic and atraumatic.  Cardiovascular:     Rate and Rhythm: Normal rate and regular rhythm.     Heart sounds: Normal heart sounds.  Pulmonary:     Effort: Pulmonary effort is normal.     Breath sounds: Normal breath sounds.  Skin:    General: Skin is warm and dry.  Neurological:     Mental Status: She is alert and oriented to person, place, and time.  Psychiatric:        Behavior: Behavior normal.      No results found for any visits on  10/03/21.    The 10-year ASCVD risk score (Arnett DK, et al., 2019) is: 5.8%    Assessment & Plan:   Problem List Items Addressed This Visit       Other   INSOMNIA - Primary    Since including going up on the Belsomra versus switching to Dayvigo.  It looks like 2020 is actually covered on her insurance plan so we will go ahead and switch.  Will start with 5 mg.  30-day supply sent to pharmacy with refills.      Relevant Medications   Lemborexant (DAYVIGO) 5 MG TABS   Encounter for weight management    She has really been working at this pretty diligently and been pretty strict with calorie tracking as well as regular exercise.  At this point I do not have anything else to really offer her that I think could be helpful.  I think she would benefit from a consultation with a bariatrician.  Happy to make that referral if she would like.  She says for right now she is just going to give it a break and might let  me know in the fall if she is ready to do something different..      Other Visit Diagnoses     Palpitations       Relevant Orders   EKG 12-Lead (Completed)   LONG TERM MONITOR (3-14 DAYS)      Palpitations  - EKG today shows rate of Plan to also order heart monitor to try to capture the episodes to see what might be going on.  And recent labs are up-to-date.  No follow-ups on file.    Nani Gasser, MD

## 2021-10-03 NOTE — Assessment & Plan Note (Signed)
She has really been working at this pretty diligently and been pretty strict with calorie tracking as well as regular exercise.  At this point I do not have anything else to really offer her that I think could be helpful.  I think she would benefit from a consultation with a bariatrician.  Happy to make that referral if she would like.  She says for right now she is just going to give it a break and might let me know in the fall if she is ready to do something different.Marland Kitchen

## 2021-10-04 ENCOUNTER — Ambulatory Visit (INDEPENDENT_AMBULATORY_CARE_PROVIDER_SITE_OTHER): Payer: Medicare HMO

## 2021-10-04 DIAGNOSIS — R002 Palpitations: Secondary | ICD-10-CM

## 2021-10-04 NOTE — Progress Notes (Unsigned)
Enrolled for [employer] to mail a ZIO XT long term holter monitor to the patients address on file.  

## 2021-10-09 DIAGNOSIS — R002 Palpitations: Secondary | ICD-10-CM

## 2021-10-18 ENCOUNTER — Encounter: Payer: Self-pay | Admitting: General Practice

## 2021-10-20 NOTE — Progress Notes (Signed)
Hi Shanyia, wanted to let you know that your heart monitor results looked really good no concerning arrhythmias.

## 2021-10-23 NOTE — Telephone Encounter (Signed)
Would she be due for any blood work or would you recommend her to have the welcome to medicare when she comes back? She will be going out of town to [place] for 6 months and was hoping to have her yearly labs done before she leaves.

## 2021-10-24 NOTE — Telephone Encounter (Signed)
Not really due until October.  Not sure when she is leaving town.  Does she want to go ahead and get it done early that is perfectly fine or does she want to wait till she is back?

## 2021-11-10 ENCOUNTER — Other Ambulatory Visit: Payer: Self-pay | Admitting: Family Medicine

## 2022-01-26 ENCOUNTER — Other Ambulatory Visit: Payer: Self-pay | Admitting: Family Medicine

## 2022-01-26 DIAGNOSIS — G2581 Restless legs syndrome: Secondary | ICD-10-CM

## 2022-04-18 ENCOUNTER — Encounter: Payer: Self-pay | Admitting: Family Medicine

## 2022-04-19 NOTE — Telephone Encounter (Signed)
Needs virtual appt

## 2022-04-23 ENCOUNTER — Telehealth (INDEPENDENT_AMBULATORY_CARE_PROVIDER_SITE_OTHER): Payer: Medicare HMO | Admitting: Family Medicine

## 2022-04-23 ENCOUNTER — Other Ambulatory Visit: Payer: Self-pay | Admitting: Family Medicine

## 2022-04-23 ENCOUNTER — Encounter: Payer: Self-pay | Admitting: Family Medicine

## 2022-04-23 DIAGNOSIS — Z7689 Persons encountering health services in other specified circumstances: Secondary | ICD-10-CM

## 2022-04-23 DIAGNOSIS — Z713 Dietary counseling and surveillance: Secondary | ICD-10-CM | POA: Diagnosis not present

## 2022-04-23 MED ORDER — CONTRAVE 8-90 MG PO TB12: ORAL_TABLET | ORAL | 0 refills | Status: DC

## 2022-04-23 NOTE — Progress Notes (Signed)
    Virtual Visit via Video Note  I connected with Jordan Maynard on 04/23/22 at 10:50 AM EST by a video enabled telemedicine application and verified that I am speaking with the correct person using two identifiers.   I discussed the limitations of evaluation and management by telemedicine and the availability of in person appointments. The patient expressed understanding and agreed to proceed.  Patient location: at home Provider location: in office  Subjective:    CC:  No chief complaint on file.   HPI: She is here to discuss her weight.  She is very frustrated.  She is up to 126 pounds.  She has been very carefully watching her calories she is been exercising 5 days/week and just cannot seem to lose weight she is very interested in an appetite suppressant.  She did some research on some of the medication options that I had sent to her.  She is not interested in phentermine or Qsymia because of the potential side effects of the stimulant including insomnia, tachycardia palpitations etc.  She is potentially interested in Contrave or in the GLP-1 she did do some price checking as well.  She does understand that Contrave can take several months to really start working well.   Past medical history, Surgical history, Family history not pertinant except as noted below, Social history, Allergies, and medications have been entered into the medical record, reviewed, and corrections made.    Objective:    General: Speaking clearly in complete sentences without any shortness of breath.  Alert and oriented x3.  Normal judgment. No apparent acute distress.    Impression and Recommendations:    Problem List Items Addressed This Visit       Other   Encounter for weight management - Primary    Visit #:1  Starting Weight: 126 lb   Current weight: 126 lb  Previous weight: 123 lbs  Change in weight: up 3 lbs  Goal weight: 155 lb Dietary goals: Continue monitoring calorie intake. Exercise  goals: Continue to work on regular exercise. Medication: Contrave starting titration.  Follow-up and referrals: 6-8 weeks.    Discussed options. Will try Contrave for 2 months. First month is a taper.  2nd month with maintenance therapy.         No orders of the defined types were placed in this encounter.   Meds ordered this encounter  Medications   Naltrexone-buPROPion HCl ER (CONTRAVE) 8-90 MG TB12    Sig: Start 1 tablet every morning for 7 days, then 1 tablet twice daily for 7 days, then 2 tablets every morning and one every evening    Dispense:  120 tablet    Refill:  0     I discussed the assessment and treatment plan with the patient. The patient was provided an opportunity to ask questions and all were answered. The patient agreed with the plan and demonstrated an understanding of the instructions.   The patient was advised to call back or seek an in-person evaluation if the symptoms worsen or if the condition fails to improve as anticipated.  I spent 20 minutes on the day of the encounter to include pre-visit record review, face-to-face time with the patient and post visit ordering of test.  Jordan Lecher, MD

## 2022-04-23 NOTE — Assessment & Plan Note (Addendum)
Visit #:1  Starting Weight: 126 lb   Current weight: 126 lb  Previous weight: 123 lbs  Change in weight: up 3 lbs  Goal weight: 155 lb Dietary goals: Continue monitoring calorie intake. Exercise goals: Continue to work on regular exercise. Medication: Contrave starting titration.  Follow-up and referrals: 6-8 weeks.    Discussed options. Will try Contrave for 2 months. First month is a taper.  2nd month with maintenance therapy.

## 2022-04-27 ENCOUNTER — Telehealth: Payer: Self-pay | Admitting: Family Medicine

## 2022-04-27 NOTE — Telephone Encounter (Signed)
Pt scheduled her Physical with Metheney in June and would like to have her lab orders entered ahead of time

## 2022-05-03 ENCOUNTER — Other Ambulatory Visit: Payer: Self-pay | Admitting: Family Medicine

## 2022-05-28 ENCOUNTER — Encounter: Payer: Self-pay | Admitting: Family Medicine

## 2022-05-28 ENCOUNTER — Other Ambulatory Visit: Payer: Self-pay | Admitting: Family Medicine

## 2022-05-28 MED ORDER — CONTRAVE 8-90 MG PO TB12: 2.0000 | ORAL_TABLET | Freq: Two times a day (BID) | ORAL | 1 refills | Status: DC

## 2022-05-29 NOTE — Telephone Encounter (Signed)
PA required for Contrave. Please update the patient as needed. Thanks in advance.

## 2022-05-31 ENCOUNTER — Encounter: Payer: Self-pay | Admitting: Family Medicine

## 2022-06-07 ENCOUNTER — Telehealth: Payer: Self-pay

## 2022-06-07 NOTE — Telephone Encounter (Signed)
These notify patient of denial and see which she would like to do.  She is also welcome to run it through GoodRx and see if it may be reasonable enough to afford without insurance.  But I do not know how much that will be.

## 2022-06-07 NOTE — Telephone Encounter (Signed)
Initiated Prior authorization [id number] 8-90MG  er tablets Via: Covermymeds Case/Key:[id number] Status: denied as of 06/07/22 Reason:The medication you have requested is not covered by Medicare Part D Law. If you believe the medication is being used for a medically accepted or compendia supported indication approved by CMS, please contact your patient's plan. Notified Pt via: Mychart

## 2022-06-26 ENCOUNTER — Other Ambulatory Visit: Payer: Self-pay | Admitting: Family Medicine

## 2022-06-26 DIAGNOSIS — Z1231 Encounter for screening mammogram for malignant neoplasm of breast: Secondary | ICD-10-CM

## 2022-06-27 DIAGNOSIS — Z1231 Encounter for screening mammogram for malignant neoplasm of breast: Secondary | ICD-10-CM

## 2022-07-05 ENCOUNTER — Other Ambulatory Visit: Payer: Self-pay | Admitting: *Deleted

## 2022-07-05 MED ORDER — CONTRAVE 8-90 MG PO TB12: 2.0000 | ORAL_TABLET | Freq: Two times a day (BID) | ORAL | 1 refills | Status: DC

## 2022-07-09 ENCOUNTER — Other Ambulatory Visit: Payer: Self-pay | Admitting: Family Medicine

## 2022-07-09 DIAGNOSIS — G2581 Restless legs syndrome: Secondary | ICD-10-CM

## 2022-07-30 ENCOUNTER — Encounter: Payer: Self-pay | Admitting: Family Medicine

## 2022-07-31 MED ORDER — VALACYCLOVIR HCL 1 G PO TABS: 2000.0000 mg | ORAL_TABLET | Freq: Two times a day (BID) | ORAL | 2 refills | Status: AC

## 2022-08-01 ENCOUNTER — Ambulatory Visit (INDEPENDENT_AMBULATORY_CARE_PROVIDER_SITE_OTHER): Payer: Medicare HMO | Admitting: Family Medicine

## 2022-08-01 ENCOUNTER — Ambulatory Visit (INDEPENDENT_AMBULATORY_CARE_PROVIDER_SITE_OTHER): Payer: Medicare HMO

## 2022-08-01 DIAGNOSIS — Z1231 Encounter for screening mammogram for malignant neoplasm of breast: Secondary | ICD-10-CM

## 2022-08-01 DIAGNOSIS — Z0000 Encounter for general adult medical examination without abnormal findings: Secondary | ICD-10-CM | POA: Diagnosis not present

## 2022-08-01 NOTE — Progress Notes (Signed)
MEDICARE ANNUAL WELLNESS VISIT  08/01/2022  Telephone Visit Disclaimer This Medicare AWV was conducted by telephone due to national recommendations for restrictions regarding the COVID-19 Pandemic (e.g. social distancing).  I verified, using two identifiers, that I am speaking with Jordan Maynard or their authorized healthcare agent. I discussed the limitations, risks, security, and privacy concerns of performing an evaluation and management service by telephone and the potential availability of an in-person appointment in the future. The patient expressed understanding and agreed to proceed.  Location of Patient: Home Location of Provider (nurse):  In the office.  Subjective:    Jordan Maynard is a 67 y.o. female patient of Methe[facility], Barbarann Ehlers, MD who had a Medicare Annual Wellness Visit today via telephone. Jordan Maynard is Retired and lives with their spouse. she does not have any children. she reports that she is socially active and does interact with friends/family regularly. she is moderately physically active and enjoys painting, running and gardening.  Patient Care Team: Agapito Games, MD as PCP - Lendon Colonel, MD as Referring Physician (Otolaryngology)     08/01/2022    8:53 AM 12/12/2015   10:30 AM  Advanced Directives  Does Patient Have a Medical Advance Directive? No No  Would patient like information on creating a medical advance directive? No - Patient declined Yes - Educational materials given    Hospital Utilization Over the Past 12 Months: # of hospitalizations or ER visits: 0 # of surgeries: 0  Review of Systems    Patient reports that her overall health is unchanged compared to last year.  History obtained from chart review and the patient  Patient Reported Readings (BP, Pulse, CBG, Weight, etc) none  Pain Assessment Pain : 0-10 Pain Score: 2  Pain Type: Chronic pain Pain Location: Back Pain Orientation: Left, Lower, Mid Pain Descriptors /  Indicators: Aching Pain Onset: More than a month ago Pain Frequency: Constant Pain Relieving Factors: meloxicam  Pain Relieving Factors: meloxicam  Current Medications & Allergies (verified) Allergies as of 08/01/2022       Reactions   Zyrtec [cetirizine] Other (See Comments)   Feels "strange" in her head.         Medication List        Accurate as of August 01, 2022  9:07 AM. If you have any questions, ask your nurse or doctor.          atorvastatin 40 MG tablet Commonly known as: LIPITOR TAKE 1 TABLET BY MOUTH EVERY DAY   calcium-vitamin D 500-200 MG-UNIT tablet Commonly known as: OSCAL WITH D Take 1 tablet by mouth daily.   Contrave 8-90 MG Tb12 Generic drug: Naltrexone-buPROPion HCl ER Take 2 tablets by mouth in the morning and at bedtime.   DayVigo 5 MG Tabs Generic drug: Lemborexant Take 5 mg by mouth at bedtime. What changed: additional instructions   meloxicam 15 MG tablet Commonly known as: MOBIC Take 1 tablet (15 mg total) by mouth daily as needed for pain.   rOPINIRole 0.5 MG tablet Commonly known as: REQUIP TAKE 1 TABLET BY MOUTH EVERYDAY AT BEDTIME What changed: See the new instructions.   valACYclovir 1000 MG tablet Commonly known as: VALTREX Take 2 tablets (2,000 mg total) by mouth 2 (two) times daily. X 1 day PRN for breakout        History (reviewed): Past Medical History:  Diagnosis Date   Hyperlipidemia    Insomnia    Past Surgical History:  Procedure Laterality Date  SEPTOPLASTY  May 2009   Family History  Problem Relation Age of Onset   Skin cancer Father    Hypertension Father    Hypertension Mother    Social History   Socioeconomic History   Marital status: Married    Spouse name: Jordan Maynard    Number of children: 0   Years of education: 16   Highest education level: Bachelor's degree (e.g., BA, AB, BS)  Occupational History   Occupation: Camera operator: [employer]   Occupation: Retired  Tobacco Use    Smoking status: Former    Types: Cigarettes    Quit date: 02/26/2006    Years since quitting: 16.4   Smokeless tobacco: Never  Vaping Use   Vaping Use: Never used  Substance and Sexual Activity   Alcohol use: Yes    Alcohol/week: 1.0 standard drink of alcohol    Types: 1 Standard drinks or equivalent per week   Drug use: No   Sexual activity: Yes    Partners: Male  Other Topics Concern   Not on file  Social History Narrative   Lives with her husband. She does not have any children. She enjoys painting, gardening and running.   Social Determinants of Health   Financial Resource Strain: Low Risk  (08/01/2022)   Overall Financial Resource Strain (CARDIA)    Difficulty of Paying Living Expenses: Not hard at all  Food Insecurity: No Food Insecurity (08/01/2022)   Hunger Vital Sign    Worried About Running Out of Food in the Last Year: Never true    Ran Out of Food in the Last Year: Never true  Transportation Needs: No Transportation Needs (08/01/2022)   PRAPARE - Administrator, Civil Service (Medical): No    Lack of Transportation (Non-Medical): No  Physical Activity: Sufficiently Active (08/01/2022)   Exercise Vital Sign    Days of Exercise per Week: 4 days    Minutes of Exercise per Session: 60 min  Stress: No Stress Concern Present (08/01/2022)   [employer] of Occupational Health - Occupational Stress Questionnaire    Feeling of Stress : Not at all  Social Connections: Socially Integrated (08/01/2022)   Social Connection and Isolation Panel [NHANES]    Frequency of Communication with Friends and Family: More than three times a week    Frequency of Social Gatherings with Friends and Family: Twice a week    Attends Religious Services: 1 to 4 times per year    Active Member of [employer] or Organizations: Yes    Attends Banker Meetings: Never    Marital Status: Married    Activities of Daily Living    08/01/2022    8:57 AM  In your present state of  health, do you have any difficulty performing the following activities:  Hearing? 0  Vision? 0  Difficulty concentrating or making decisions? 0  Walking or climbing stairs? 1  Comment sometimes  Dressing or bathing? 0  Doing errands, shopping? 0  Preparing Food and eating ? N  Using the Toilet? N  In the past six months, have you accidently leaked urine? N  Do you have problems with loss of bowel control? N  Managing your Medications? N  Managing your Finances? N  Housekeeping or managing your Housekeeping? N    Patient Education/ Literacy How often do you need to have someone help you when you read instructions, pamphlets, or other written materials from your doctor or pharmacy?: 1 - Never  What is the last grade level you completed in school?: Bachelor's degree  Exercise Current Exercise Habits: Home exercise routine, Type of exercise: Other - see comments;stretching (running, tennis), Time (Minutes): 60, Frequency (Times/Week): 4, Weekly Exercise (Minutes/Week): 240, Intensity: Moderate, Exercise limited by: None identified;orthopedic condition(s)  Diet Patient reports consuming 2 meals a day and 2 snack(s) a day Patient reports that her primary diet is: Regular Patient reports that she does have regular access to food.   Depression Screen    08/01/2022    8:53 AM 10/03/2021    1:26 PM 11/25/2020    4:06 PM 06/23/2020    1:09 PM 06/23/2019    4:32 PM 12/23/2018    9:51 AM 06/19/2018    9:32 AM  PHQ 2/9 Scores  PHQ - 2 Score 0 2 0 0 0 0 0  PHQ- 9 Score     2       Fall Risk    08/01/2022    8:53 AM 10/03/2021    1:27 PM 08/02/2021   10:01 AM 11/25/2020    4:06 PM 12/23/2018    9:51 AM  Fall Risk   Falls in the past year? 0 0 0 0 0  Number falls in past yr: 0 0 0 0 0  Injury with Fall? 0 0 0 0 0  Risk for fall due to : No Fall Risks  No Fall Risks    Follow up Falls evaluation completed  Falls prevention discussed;Falls evaluation completed Falls evaluation completed       Objective:  Jordan Maynard seemed alert and oriented and she participated appropriately during our telephone visit.  Blood Pressure Weight BMI  BP Readings from Last 3 Encounters:  10/03/21 137/77  08/02/21 123/65  11/25/20 122/65   Wt Readings from Last 3 Encounters:  10/03/21 123 lb (55.8 kg)  08/02/21 123 lb (55.8 kg)  11/25/20 117 lb (53.1 kg)   BMI Readings from Last 1 Encounters:  10/03/21 23.24 kg/m    *Unable to obtain current vital signs, weight, and BMI due to telephone visit type  Hearing/Vision  Jordan Maynard did not seem to have difficulty with hearing/understanding during the telephone conversation Reports that she has had a formal eye exam by an eye care professional within the past year Reports that she has not had a formal hearing evaluation within the past year *Unable to fully assess hearing and vision during telephone visit type  Cognitive Function:    08/01/2022    8:58 AM  6CIT Screen  What Year? 0 points  What month? 0 points  What time? 0 points  Count back from 20 0 points  Months in reverse 0 points  Repeat phrase 0 points  Total Score 0 points   (Normal:0-7, Significant for Dysfunction: >8)  Normal Cognitive Function Screening: Yes   Immunization & Health Maintenance Record Immunization History  Administered Date(s) Administered   Influenza,inj,Quad PF,6+ Mos 12/23/2018, 12/23/2019   Moderna Sars-Covid-2 Vaccination 02/17/2020   PFIZER(Purple Top)SARS-COV-2 Vaccination 04/23/2019, 05/19/2019   Td 02/27/1999, 05/25/2009   Tdap 11/25/2020   Zoster Recombinat (Shingrix) 05/05/2018, 07/10/2018    Health Maintenance  Topic Date Due   Pneumonia Vaccine 60+ Years old (1 of 1 - PCV) 08/03/2022 (Originally 06/06/2021)   COVID-19 Vaccine (4 - 2023-24 season) 08/17/2022 (Originally 10/27/2021)   INFLUENZA VACCINE  09/27/2022   MAMMOGRAM  07/07/2023   Medicare Annual Wellness (AWV)  08/01/2023   Colonoscopy  08/06/2023   DEXA SCAN  08/23/2024    DTaP/Tdap/Td (  4 - Td or Tdap) 11/26/2030   Hepatitis C Screening  Completed   Zoster Vaccines- Shingrix  Completed   HPV VACCINES  Aged Out       Assessment  This is a routine wellness examination for [employer].  Health Maintenance: Due or Overdue There are no preventive care reminders to display for this patient.   Jordan Maynard does not need a referral for Community Assistance: Care Management:   no Social Work:    no Prescription Assistance:  no Nutrition/Diabetes Education:  no   Plan:  Personalized Goals  Goals Addressed               This Visit's Progress     Patient Stated (pt-stated)        Patient stated that she would like to have more energy.       Personalized Health Maintenance & Screening Recommendations  Screening mammography - scheduled Pneumonia vaccine Colonoscopy due June, 2025.  Lung Cancer Screening Recommended: no (Low Dose CT Chest recommended if Age 52-80 years, 20 pack-year currently smoking OR have quit w/in past 15 years) Hepatitis C Screening recommended: no HIV Screening recommended: no  Advanced Directives: Written information was not prepared per patient's request.  Referrals & Orders No orders of the defined types were placed in this encounter.   Follow-up Plan Follow-up with Agapito Games, MD as planned. Schedule pneumonia vaccine at the pharmacy. Medicare wellness visit in one year.  Patient will access AVS on my chart.   I have personally reviewed and noted the following in the patient's chart:   Medical and social history Use of alcohol, tobacco or illicit drugs  Current medications and supplements Functional ability and status Nutritional status Physical activity Advanced directives List of other physicians Hospitalizations, surgeries, and ER visits in previous 12 months Vitals Screenings to include cognitive, depression, and falls Referrals and appointments  In addition, I have reviewed and  discussed with Jordan Maynard certain preventive protocols, quality metrics, and best practice recommendations. A written personalized care plan for preventive services as well as general preventive health recommendations is available and can be mailed to the patient at her request.      Modesto Charon, RN BSN  08/01/2022

## 2022-08-01 NOTE — Patient Instructions (Addendum)
MEDICARE ANNUAL WELLNESS VISIT Health Maintenance Summary and Written Plan of Care  [name] ,  Thank you for allowing me to perform your Medicare Annual Wellness Visit and for your ongoing commitment to your health.   Health Maintenance & Immunization History Health Maintenance  Topic Date Due   Pneumonia Vaccine 18+ Years old (1 of 1 - PCV) 08/03/2022 (Originally 06/06/2021)   COVID-19 Vaccine (4 - 2023-24 season) 08/17/2022 (Originally 10/27/2021)   INFLUENZA VACCINE  09/27/2022   MAMMOGRAM  07/07/2023   Medicare Annual Wellness (AWV)  08/01/2023   Colonoscopy  08/06/2023   DEXA SCAN  08/23/2024   DTaP/Tdap/Td (4 - Td or Tdap) 11/26/2030   Hepatitis C Screening  Completed   Zoster Vaccines- Shingrix  Completed   HPV VACCINES  Aged Out   Immunization History  Administered Date(s) Administered   Influenza,inj,Quad PF,6+ Mos 12/23/2018, 12/23/2019   Moderna Sars-Covid-2 Vaccination 02/17/2020   PFIZER(Purple Top)SARS-COV-2 Vaccination 04/23/2019, 05/19/2019   Td 02/27/1999, 05/25/2009   Tdap 11/25/2020   Zoster Recombinat (Shingrix) 05/05/2018, 07/10/2018    These are the patient goals that we discussed:  Goals Addressed               This Visit's Progress     Patient Stated (pt-stated)        Patient stated that she would like to have more energy.         This is a list of Health Maintenance Items that are overdue or due now: Screening mammography - scheduled Pneumonia vaccine Colonoscopy due June, 2025.    Orders/Referrals Placed Today: No orders of the defined types were placed in this encounter.  (Contact our referral department at [phone] if you have not spoken with someone about your referral appointment within the next 5 days)    Follow-up Plan Follow-up with Agapito Games, MD as planned. Schedule pneumonia vaccine at the pharmacy. Medicare wellness visit in one year.  Patient will access AVS on my chart.      Health  Maintenance, Female Adopting a healthy lifestyle and getting preventive care are important in promoting health and wellness. Ask your health care provider about: The right schedule for you to have regular tests and exams. Things you can do on your own to prevent diseases and keep yourself healthy. What should I know about diet, weight, and exercise? Eat a healthy diet  Eat a diet that includes plenty of vegetables, fruits, low-fat dairy products, and lean protein. Do not eat a lot of foods that are high in solid fats, added sugars, or sodium. Maintain a healthy weight Body mass index (BMI) is used to identify weight problems. It estimates body fat based on height and weight. Your health care provider can help determine your BMI and help you achieve or maintain a healthy weight. Get regular exercise Get regular exercise. This is one of the most important things you can do for your health. Most adults should: Exercise for at least 150 minutes each week. The exercise should increase your heart rate and make you sweat (moderate-intensity exercise). Do strengthening exercises at least twice a week. This is in addition to the moderate-intensity exercise. Spend less time sitting. Even light physical activity can be beneficial. Watch cholesterol and blood lipids Have your blood tested for lipids and cholesterol at 66 years of age, then have this test every 5 years. Have your cholesterol levels checked more often if: Your lipid or cholesterol levels are high. You are older than 66 years of  age. Bonita Quin are at high risk for heart disease. What should I know about cancer screening? Depending on your health history and family history, you may need to have cancer screening at various ages. This may include screening for: Breast cancer. Cervical cancer. Colorectal cancer. Skin cancer. Lung cancer. What should I know about heart disease, diabetes, and high blood pressure? Blood pressure and heart  disease High blood pressure causes heart disease and increases the risk of stroke. This is more likely to develop in people who have high blood pressure readings or are overweight. Have your blood pressure checked: Every 3-5 years if you are 37-43 years of age. Every year if you are 16 years old or older. Diabetes Have regular diabetes screenings. This checks your fasting blood sugar level. Have the screening done: Once every three years after age 59 if you are at a normal weight and have a low risk for diabetes. More often and at a younger age if you are overweight or have a high risk for diabetes. What should I know about preventing infection? Hepatitis B If you have a higher risk for hepatitis B, you should be screened for this virus. Talk with your health care provider to find out if you are at risk for hepatitis B infection. Hepatitis C Testing is recommended for: Everyone born from [birth year] through 1964. Anyone with known risk factors for hepatitis C. Sexually transmitted infections (STIs) Get screened for STIs, including gonorrhea and chlamydia, if: You are sexually active and are younger than 66 years of age. You are older than 66 years of age and your health care provider tells you that you are at risk for this type of infection. Your sexual activity has changed since you were last screened, and you are at increased risk for chlamydia or gonorrhea. Ask your health care provider if you are at risk. Ask your health care provider about whether you are at high risk for HIV. Your health care provider may recommend a prescription medicine to help prevent HIV infection. If you choose to take medicine to prevent HIV, you should first get tested for HIV. You should then be tested every 3 months for as long as you are taking the medicine. Pregnancy If you are about to stop having your period (premenopausal) and you may become pregnant, seek counseling before you get pregnant. Take 400 to 800  micrograms (mcg) of folic acid every day if you become pregnant. Ask for birth control (contraception) if you want to prevent pregnancy. Osteoporosis and menopause Osteoporosis is a disease in which the bones lose minerals and strength with aging. This can result in bone fractures. If you are 15 years old or older, or if you are at risk for osteoporosis and fractures, ask your health care provider if you should: Be screened for bone loss. Take a calcium or vitamin D supplement to lower your risk of fractures. Be given hormone replacement therapy (HRT) to treat symptoms of menopause. Follow these instructions at home: Alcohol use Do not drink alcohol if: Your health care provider tells you not to drink. You are pregnant, may be pregnant, or are planning to become pregnant. If you drink alcohol: Limit how much you have to: 0-1 drink a day. Know how much alcohol is in your drink. In the [place] one drink equals one 12 oz bottle of beer (355 mL), one 5 oz glass of wine (148 mL), or one 1 oz glass of hard liquor (44 mL). Lifestyle Do not use any products  that contain nicotine or tobacco. These products include cigarettes, chewing tobacco, and vaping devices, such as e-cigarettes. If you need help quitting, ask your health care provider. Do not use street drugs. Do not share needles. Ask your health care provider for help if you need support or information about quitting drugs. General instructions Schedule regular health, dental, and eye exams. Stay current with your vaccines. Tell your health care provider if: You often feel depressed. You have ever been abused or do not feel safe at home. Summary Adopting a healthy lifestyle and getting preventive care are important in promoting health and wellness. Follow your health care provider's instructions about healthy diet, exercising, and getting tested or screened for diseases. Follow your health care provider's instructions on monitoring your  cholesterol and blood pressure. This information is not intended to replace advice given to you by your health care provider. Make sure you discuss any questions you have with your health care provider. Document Revised: 07/04/2020 Document Reviewed: 07/04/2020 Elsevier Patient Education  2024 [employer].

## 2022-08-02 NOTE — Progress Notes (Signed)
Please call patient. Normal mammogram.  Repeat in 1 year.  

## 2022-08-13 ENCOUNTER — Encounter: Payer: Medicare HMO | Admitting: Family Medicine

## 2022-08-20 ENCOUNTER — Ambulatory Visit (INDEPENDENT_AMBULATORY_CARE_PROVIDER_SITE_OTHER): Payer: Medicare HMO | Admitting: Family Medicine

## 2022-08-20 ENCOUNTER — Encounter: Payer: Self-pay | Admitting: Family Medicine

## 2022-08-20 VITALS — BP 127/89 | HR 86 | Ht 61.0 in | Wt 117.0 lb

## 2022-08-20 DIAGNOSIS — E7849 Other hyperlipidemia: Secondary | ICD-10-CM

## 2022-08-20 DIAGNOSIS — R202 Paresthesia of skin: Secondary | ICD-10-CM

## 2022-08-20 DIAGNOSIS — Z23 Encounter for immunization: Secondary | ICD-10-CM | POA: Diagnosis not present

## 2022-08-20 DIAGNOSIS — R0989 Other specified symptoms and signs involving the circulatory and respiratory systems: Secondary | ICD-10-CM

## 2022-08-20 DIAGNOSIS — Z7689 Persons encountering health services in other specified circumstances: Secondary | ICD-10-CM

## 2022-08-20 DIAGNOSIS — Z1329 Encounter for screening for other suspected endocrine disorder: Secondary | ICD-10-CM

## 2022-08-20 DIAGNOSIS — Z0000 Encounter for general adult medical examination without abnormal findings: Secondary | ICD-10-CM | POA: Diagnosis not present

## 2022-08-20 MED ORDER — CONTRAVE 8-90 MG PO TB12: 2.0000 | ORAL_TABLET | Freq: Two times a day (BID) | ORAL | 1 refills | Status: DC

## 2022-08-20 NOTE — Progress Notes (Signed)
Complete physical exam  Patient: Jordan Maynard   DOB: 24-May-1955   67 y.o. Female  MRN: [id number]  Subjective:    No chief complaint on file.   Jordan Maynard is a 67 y.o. female who presents today for a complete physical exam. She reports consuming a general diet. Home exercise routine includes tennis and gym regularly . She generally feels well. . She does not have additional problems to discuss today.   She did also let me know about a couple episodes that she has had where she felt like her throat was thick or full or slightly swollen.  She was noticing it occasionally in the morning and would notice that sometimes it was hard to swallow her pills.  Couple weeks ago when she went to the beach she had that sensation all day which was the first time that that happened.  She does not normally have problems with swallowing.  She does not typically have reflux or GERD issues.  She is also been noticing that sometimes at night she will feel like her legs have like a heat in them.  Does not usually affect her feet just the legs.  She says sometimes it feels like it is hard to sit still she just has to get up and move.  Occasionally she will also get a tickling sensation around both of her knees again in the evenings.  Usually happens about 3 nights per week.  Most recent fall risk assessment:    08/20/2022    2:05 PM  Fall Risk   Falls in the past year? 0  Number falls in past yr: 0  Injury with Fall? 0  Risk for fall due to : No Fall Risks  Follow up Falls evaluation completed     Most recent depression screenings:    08/20/2022    2:05 PM 08/01/2022    8:53 AM  PHQ 2/9 Scores  PHQ - 2 Score 0 0        Patient Care Team: Agapito Games, MD as PCP - Lendon Colonel, MD as Referring Physician (Otolaryngology)   Outpatient Medications Prior to Visit  Medication Sig   atorvastatin (LIPITOR) 40 MG tablet TAKE 1 TABLET BY MOUTH EVERY DAY   calcium-vitamin D (OSCAL WITH  D) 500-200 MG-UNIT per tablet Take 1 tablet by mouth daily.   Lemborexant (DAYVIGO) 5 MG TABS Take 5 mg by mouth at bedtime. (Patient taking differently: Take 5 mg by mouth at bedtime. As needed)   meloxicam (MOBIC) 15 MG tablet Take 1 tablet (15 mg total) by mouth daily as needed for pain.   rOPINIRole (REQUIP) 0.5 MG tablet TAKE 1 TABLET BY MOUTH EVERYDAY AT BEDTIME (Patient taking differently: PRN)   valACYclovir (VALTREX) 1000 MG tablet Take 2 tablets (2,000 mg total) by mouth 2 (two) times daily. X 1 day PRN for breakout   [DISCONTINUED] Naltrexone-buPROPion HCl ER (CONTRAVE) 8-90 MG TB12 Take 2 tablets by mouth in the morning and at bedtime. (Patient not taking: Reported on 08/01/2022)   No facility-administered medications prior to visit.    ROS        Objective:     BP 127/89   Pulse 86   Ht 5\' 1"  (1.549 m)   Wt 117 lb (53.1 kg)   SpO2 98%   BMI 22.11 kg/m    Physical Exam Vitals and nursing note reviewed.  Constitutional:      Appearance: She is well-developed.  HENT:  Head: Normocephalic and atraumatic.     Right Ear: Tympanic membrane, ear canal and external ear normal.     Left Ear: Tympanic membrane, ear canal and external ear normal.     Nose: Nose normal.     Mouth/Throat:     Pharynx: Oropharynx is clear.  Eyes:     Conjunctiva/sclera: Conjunctivae normal.     Pupils: Pupils are equal, round, and reactive to light.  Neck:     Thyroid: No thyromegaly.  Cardiovascular:     Rate and Rhythm: Normal rate and regular rhythm.     Heart sounds: Normal heart sounds.  Pulmonary:     Effort: Pulmonary effort is normal.     Breath sounds: Normal breath sounds. No wheezing.  Musculoskeletal:     Cervical back: Neck supple. No tenderness.  Lymphadenopathy:     Cervical: No cervical adenopathy.  Skin:    General: Skin is warm and dry.     Coloration: Skin is not pale.  Neurological:     Mental Status: She is alert and oriented to person, place, and time.   Psychiatric:        Behavior: Behavior normal.      No results found for any visits on 08/20/22.     Assessment & Plan:    Routine Health Maintenance and Physical Exam  Immunization History  Administered Date(s) Administered   Influenza,inj,Quad PF,6+ Mos 12/23/2018, 12/23/2019   Moderna Sars-Covid-2 Vaccination 02/17/2020   PFIZER(Purple Top)SARS-COV-2 Vaccination 04/23/2019, 05/19/2019   PNEUMOCOCCAL CONJUGATE-20 08/20/2022   Td 02/27/1999, 05/25/2009   Tdap 11/25/2020   Zoster Recombinat (Shingrix) 05/05/2018, 07/10/2018    Health Maintenance  Topic Date Due   COVID-19 Vaccine (4 - 2023-24 season) 10/27/2021   INFLUENZA VACCINE  09/27/2022   Medicare Annual Wellness (AWV)  08/01/2023   Colonoscopy  08/06/2023   MAMMOGRAM  07/31/2024   DEXA SCAN  08/23/2024   DTaP/Tdap/Td (4 - Td or Tdap) 11/26/2030   Pneumonia Vaccine 36+ Years old  Completed   Hepatitis C Screening  Completed   Zoster Vaccines- Shingrix  Completed   HPV VACCINES  Aged Out    Discussed health benefits of physical activity, and encouraged her to engage in regular exercise appropriate for her age and condition.  Problem List Items Addressed This Visit       Other   Hyperlipidemia - Primary   Relevant Orders   TSH   COMPLETE METABOLIC PANEL WITH GFR   Lipid Panel w/reflex Direct LDL   CBC   Encounter for weight management   Relevant Medications   Naltrexone-buPROPion HCl ER (CONTRAVE) 8-90 MG TB12   Other Visit Diagnoses     Screening for endocrine disorder       Relevant Orders   TSH   COMPLETE METABOLIC PANEL WITH GFR   Lipid Panel w/reflex Direct LDL   CBC   Healthcare maintenance       Relevant Orders   TSH   COMPLETE METABOLIC PANEL WITH GFR   Lipid Panel w/reflex Direct LDL   CBC   Throat fullness       Paresthesia       Relevant Orders   Fe+TIBC+Fer   Encounter for immunization       Relevant Orders   Pneumococcal conjugate vaccine 20-valent (Completed)        Keep up a regular exercise program and make sure you are eating a healthy diet Try to eat 4 servings of dairy a day, or if you are  lactose intolerant take a calcium with vitamin D daily.  Your vaccines are up to date.   Throat fullness -we discussed referral to gastroenterology for further workup and potential causes for the sensation that she is experiencing.  It has not happened again since her beach trip so for now she just wants to monitor but if it continues to occur then I think she days to be further evaluated it does not sound like typical allergic reaction.  I think the paresthesias in the heat sensation and tingling sensation that she is feeling in her legs is probably related to her restless leg I think she is just noticing it more in the evening instead of just at bedtime.  We discussed maybe splitting the Requip in half and taking a half of a tab as needed in the evenings she says she does get relief if she takes it.  Will also check iron levels just to rule out iron deficiency.  Go ahead and refill the Contrave for another month so that she can continue to taper off.  She does feel like it was helpful in reducing her appetite.  Return in about 1 year (around 08/20/2023) for Wellness Exam.     Nani Gasser, MD

## 2022-08-23 LAB — CBC
HCT: 41.9 % (ref 35.0–45.0)
RDW: 12.4 % (ref 11.0–15.0)
WBC: 5.5 10*3/uL (ref 3.8–10.8)

## 2022-08-24 LAB — IRON,TIBC AND FERRITIN PANEL
%SAT: 30 % (calc) (ref 16–45)
Ferritin: 112 ng/mL (ref 16–288)
Iron: 90 ug/dL (ref 45–160)
TIBC: 304 mcg/dL (calc) (ref 250–450)

## 2022-08-24 LAB — LIPID PANEL W/REFLEX DIRECT LDL
Cholesterol: 181 mg/dL (ref <200)
HDL: 66 mg/dL (ref >=50)
LDL Cholesterol (Calc): 90 mg/dL (calc)
Non-HDL Cholesterol (Calc): 115 mg/dL (calc) (ref <130)
Total CHOL/HDL Ratio: 2.7 (calc) (ref <5.0)
Triglycerides: 153 mg/dL — ABNORMAL HIGH (ref <150)

## 2022-08-24 LAB — COMPLETE METABOLIC PANEL WITH GFR
AG Ratio: 1.9 (calc) (ref 1.0–2.5)
ALT: 15 U/L (ref 6–29)
AST: 16 U/L (ref 10–35)
Albumin: 4.6 g/dL (ref 3.6–5.1)
Alkaline phosphatase (APISO): 82 U/L (ref 37–153)
BUN: 17 mg/dL (ref 7–25)
CO2: 30 mmol/L (ref 20–32)
Calcium: 9.7 mg/dL (ref 8.6–10.4)
Chloride: 103 mmol/L (ref 98–110)
Creat: 0.84 mg/dL (ref 0.50–1.05)
Globulin: 2.4 g/dL (calc) (ref 1.9–3.7)
Glucose, Bld: 95 mg/dL (ref 65–99)
Potassium: 4.8 mmol/L (ref 3.5–5.3)
Sodium: 144 mmol/L (ref 135–146)
Total Bilirubin: 0.6 mg/dL (ref 0.2–1.2)
Total Protein: 7 g/dL (ref 6.1–8.1)
eGFR: 77 mL/min/{1.73_m2} (ref >=60)

## 2022-08-24 LAB — CBC
Hemoglobin: 14.3 g/dL (ref 11.7–15.5)
MCH: 31.8 pg (ref 27.0–33.0)
MCHC: 34.1 g/dL (ref 32.0–36.0)
MCV: 93.3 fL (ref 80.0–100.0)
MPV: 9.9 fL (ref 7.5–12.5)
Platelets: 268 10*3/uL (ref 140–400)
RBC: 4.49 10*6/uL (ref 3.80–5.10)

## 2022-08-24 LAB — TSH: TSH: 2.06 mIU/L (ref 0.40–4.50)

## 2022-08-24 NOTE — Progress Notes (Signed)
Vernal, LDL cholesterol is at goal.  Triglycerides just borderline but better than the last couple years which is great.  Blood count and metabolic panel look good.  Iron levels are normal.  Thyroid looks good as well.

## 2022-09-27 ENCOUNTER — Encounter: Payer: Self-pay | Admitting: Family Medicine

## 2022-09-28 MED ORDER — ATORVASTATIN CALCIUM 40 MG PO TABS: 40.0000 mg | ORAL_TABLET | Freq: Every day | ORAL | 3 refills | Status: DC

## 2022-11-15 ENCOUNTER — Encounter: Payer: Self-pay | Admitting: Family Medicine

## 2022-11-15 DIAGNOSIS — F5101 Primary insomnia: Secondary | ICD-10-CM

## 2022-11-16 MED ORDER — DAYVIGO 5 MG PO TABS
5.0000 mg | ORAL_TABLET | Freq: Every day | ORAL | 1 refills | Status: DC
Start: 2022-11-16 — End: 2023-08-21

## 2023-01-03 ENCOUNTER — Other Ambulatory Visit: Payer: Self-pay | Admitting: Family Medicine

## 2023-01-03 DIAGNOSIS — G2581 Restless legs syndrome: Secondary | ICD-10-CM

## 2023-03-25 ENCOUNTER — Other Ambulatory Visit: Payer: Self-pay | Admitting: Family Medicine

## 2023-03-25 ENCOUNTER — Encounter: Payer: Self-pay | Admitting: Family Medicine

## 2023-03-25 DIAGNOSIS — G2581 Restless legs syndrome: Secondary | ICD-10-CM

## 2023-03-27 MED ORDER — ROPINIROLE HCL 1 MG PO TABS: 1.0000 mg | ORAL_TABLET | Freq: Every day | ORAL | 1 refills | Status: DC

## 2023-03-27 NOTE — Addendum Note (Signed)
Addended by: Nani Gasser D on: 03/27/2023 01:42 PM   Modules accepted: Orders

## 2023-05-27 ENCOUNTER — Other Ambulatory Visit: Payer: Self-pay | Admitting: Family Medicine

## 2023-05-27 DIAGNOSIS — Z7689 Persons encountering health services in other specified circumstances: Secondary | ICD-10-CM

## 2023-05-31 NOTE — Telephone Encounter (Signed)
 She had tapered off last Sept.

## 2023-06-27 ENCOUNTER — Other Ambulatory Visit: Payer: Self-pay | Admitting: Family Medicine

## 2023-06-27 DIAGNOSIS — Z1231 Encounter for screening mammogram for malignant neoplasm of breast: Secondary | ICD-10-CM

## 2023-07-04 ENCOUNTER — Other Ambulatory Visit: Payer: Self-pay | Admitting: Family Medicine

## 2023-08-01 ENCOUNTER — Other Ambulatory Visit: Payer: Self-pay | Admitting: Medical Genetics

## 2023-08-07 ENCOUNTER — Ambulatory Visit

## 2023-08-07 DIAGNOSIS — Z1231 Encounter for screening mammogram for malignant neoplasm of breast: Secondary | ICD-10-CM

## 2023-08-08 ENCOUNTER — Ambulatory Visit: Payer: Self-pay | Admitting: Family Medicine

## 2023-08-08 NOTE — Progress Notes (Signed)
 Please call patient. Normal mammogram.  Repeat in 1 year.

## 2023-08-21 ENCOUNTER — Encounter: Payer: Self-pay | Admitting: Family Medicine

## 2023-08-21 ENCOUNTER — Ambulatory Visit (INDEPENDENT_AMBULATORY_CARE_PROVIDER_SITE_OTHER): Admitting: Family Medicine

## 2023-08-21 VITALS — BP 133/64 | HR 65 | Ht 61.0 in | Wt 117.0 lb

## 2023-08-21 DIAGNOSIS — Z0000 Encounter for general adult medical examination without abnormal findings: Secondary | ICD-10-CM | POA: Diagnosis not present

## 2023-08-21 DIAGNOSIS — R070 Pain in throat: Secondary | ICD-10-CM | POA: Diagnosis not present

## 2023-08-21 DIAGNOSIS — F410 Panic disorder [episodic paroxysmal anxiety] without agoraphobia: Secondary | ICD-10-CM | POA: Diagnosis not present

## 2023-08-21 DIAGNOSIS — F5101 Primary insomnia: Secondary | ICD-10-CM

## 2023-08-21 DIAGNOSIS — Z7689 Persons encountering health services in other specified circumstances: Secondary | ICD-10-CM

## 2023-08-21 DIAGNOSIS — G2581 Restless legs syndrome: Secondary | ICD-10-CM

## 2023-08-21 MED ORDER — MELOXICAM 15 MG PO TABS: 15.0000 mg | ORAL_TABLET | Freq: Every day | ORAL | 1 refills | Status: DC | PRN

## 2023-08-21 MED ORDER — TRAZODONE HCL 50 MG PO TABS: 25.0000 mg | ORAL_TABLET | Freq: Every evening | ORAL | 3 refills | Status: DC | PRN

## 2023-08-21 MED ORDER — CONTRAVE 8-90 MG PO TB12
2.0000 | ORAL_TABLET | Freq: Two times a day (BID) | ORAL | 0 refills | Status: AC
Start: 2023-08-21 — End: ?

## 2023-08-21 NOTE — Assessment & Plan Note (Signed)
 She said she did really well on the Contrave .  She started at the 126 pounds and is now down to 117 pounds but says she actually gained back about 4 pounds in the last 8 weeks so she was even lower.  She said she went down to once a day on the medication for several weeks before she came completely off but has noticed that the weights been creeping back up.  She is just frustrated because she tends to gain weight in the middle she is very physically active she actually feels like she eats very healthy lots of vegetables.

## 2023-08-21 NOTE — Assessment & Plan Note (Addendum)
 Will remove Dayvigo  from her med list.  Right now she is just mostly using Gummies continue to work on good sleep techniques. She would like to try trazodone.

## 2023-08-21 NOTE — Patient Instructions (Addendum)
 Do a trial of Nexium or Prilosec about 1 hour before bedtime for the next 8 weeks and see if helps with throat/panic sensation.    Can try Trazodone for sleep

## 2023-08-21 NOTE — Progress Notes (Signed)
 SABRA

## 2023-08-21 NOTE — Progress Notes (Signed)
 Complete physical exam  Patient: Jordan Maynard   DOB: 1956/01/11   68 y.o. Female  MRN: [id number]  Subjective:    Chief Complaint  Patient presents with   Annual Exam    Non fasting     Jordan Maynard is a 68 y.o. female who presents today for a complete physical exam. She reports consuming a general diet. Running and playing tennis She generally feels well.  She does have additional problems to discuss today.   #1.  She wants to discuss weight management she was previously on Contrave  but tapered off a couple of months ago but now her weight is coming back.  #2 insomnia-she felt like the Dayvigo  just really was not very helpful so she is quit taking it she has been using Gummies lately that seems to work a little bit better.  #3 she feels like she is been having some panic attacks at night she says its almost always after she lays down at night maybe it once every 3 weeks she will feel like suddenly her throat is swollen and like she cannot breathe she usually has to sit up try to catch her breath try to talk herself down and then usually it does ease off enough that she is able to fall asleep.  She does not feel particularly stressed or anxious right now things are normal.  No recent changes in her life.   Most recent fall risk assessment:    08/20/2022    2:05 PM  Fall Risk   Falls in the past year? 0  Number falls in past yr: 0  Injury with Fall? 0  Risk for fall due to : No Fall Risks  Follow up Falls evaluation completed     Most recent depression screenings:    08/20/2022    2:05 PM 08/01/2022    8:53 AM  PHQ 2/9 Scores  PHQ - 2 Score 0 0        Patient Care Team: Alvan Dorothyann BIRCH, MD as PCP - Diedre Jenel Rush, MD as Referring Physician (Otolaryngology)   Outpatient Medications Prior to Visit  Medication Sig   atorvastatin  (LIPITOR) 40 MG tablet TAKE 1 TABLET BY MOUTH EVERY DAY   calcium -vitamin D  (OSCAL WITH D) 500-200 MG-UNIT per tablet Take 1 tablet  by mouth daily.   rOPINIRole  (REQUIP ) 1 MG tablet Take 1 tablet (1 mg total) by mouth at bedtime.   valACYclovir  (VALTREX ) 1000 MG tablet Take 2 tablets (2,000 mg total) by mouth 2 (two) times daily. X 1 day PRN for breakout   [DISCONTINUED] Lemborexant  (DAYVIGO ) 5 MG TABS Take 1 tablet (5 mg total) by mouth at bedtime.   [DISCONTINUED] meloxicam  (MOBIC ) 15 MG tablet Take 1 tablet (15 mg total) by mouth daily as needed for pain.   [DISCONTINUED] Naltrexone-buPROPion HCl ER (CONTRAVE ) 8-90 MG TB12 Take 2 tablets by mouth in the morning and at bedtime.   [DISCONTINUED] rOPINIRole  (REQUIP ) 0.5 MG tablet TAKE 1 TABLET BY MOUTH EVERYDAY AT BEDTIME   No facility-administered medications prior to visit.    ROS        Objective:     BP 133/64   Pulse 65   Ht 5' 1 (1.549 m)   Wt 117 lb (53.1 kg)   SpO2 99%   BMI 22.11 kg/m    Physical Exam Vitals reviewed.  Constitutional:      Appearance: Normal appearance.  HENT:     Head: Normocephalic and atraumatic.  Right Ear: Tympanic membrane, ear canal and external ear normal.     Left Ear: Tympanic membrane, ear canal and external ear normal.     Nose: Nose normal.     Mouth/Throat:     Pharynx: Oropharynx is clear.   Eyes:     Extraocular Movements: Extraocular movements intact.     Conjunctiva/sclera: Conjunctivae normal.     Pupils: Pupils are equal, round, and reactive to light.   Neck:     Thyroid: No thyromegaly.   Cardiovascular:     Rate and Rhythm: Normal rate and regular rhythm.  Pulmonary:     Effort: Pulmonary effort is normal.     Breath sounds: Normal breath sounds.  Abdominal:     General: Bowel sounds are normal.     Palpations: Abdomen is soft.     Tenderness: There is no abdominal tenderness.   Musculoskeletal:        General: No swelling.     Cervical back: Neck supple.   Skin:    General: Skin is warm and dry.   Neurological:     Mental Status: She is alert and oriented to person, place, and  time.   Psychiatric:        Mood and Affect: Mood normal.        Behavior: Behavior normal.      No results found for any visits on 08/21/23.      Assessment & Plan:    Routine Health Maintenance and Physical Exam  Immunization History  Administered Date(s) Administered   Influenza,inj,Quad PF,6+ Mos 12/23/2018, 12/23/2019   Moderna Sars-Covid-2 Vaccination 02/17/2020   PFIZER(Purple Top)SARS-COV-2 Vaccination 04/23/2019, 05/19/2019   PNEUMOCOCCAL CONJUGATE-20 08/20/2022   Td 02/27/1999, 05/25/2009   Tdap 11/25/2020   Zoster Recombinant(Shingrix ) 05/05/2018, 07/10/2018    Health Maintenance  Topic Date Due   COVID-19 Vaccine (4 - 2024-25 season) 10/28/2022   Medicare Annual Wellness (AWV)  08/01/2023   Colonoscopy  08/06/2023   INFLUENZA VACCINE  09/27/2023   DEXA SCAN  08/23/2024   MAMMOGRAM  08/06/2025   DTaP/Tdap/Td (4 - Td or Tdap) 11/26/2030   Pneumococcal Vaccine: 50+ Years  Completed   Hepatitis C Screening  Completed   Zoster Vaccines- Shingrix   Completed   Hepatitis B Vaccines  Aged Out   HPV VACCINES  Aged Out   Meningococcal B Vaccine  Aged Out    Discussed health benefits of physical activity, and encouraged her to engage in regular exercise appropriate for her age and condition.  Problem List Items Addressed This Visit       Other   RLS (restless legs syndrome)   Relevant Orders   Ferritin   INSOMNIA   Will remove Dayvigo  from her med list.  Right now she is just mostly using Gummies continue to work on good sleep techniques. She would like to try trazodone.        Relevant Medications   traZODone (DESYREL) 50 MG tablet   Encounter for weight management   She said she did really well on the Contrave .  She started at the 126 pounds and is now down to 117 pounds but says she actually gained back about 4 pounds in the last 8 weeks so she was even lower.  She said she went down to once a day on the medication for several weeks before she came  completely off but has noticed that the weights been creeping back up.  She is just frustrated because she tends to gain weight  in the middle she is very physically active she actually feels like she eats very healthy lots of vegetables.      Relevant Medications   Naltrexone-buPROPion HCl ER (CONTRAVE ) 8-90 MG TB12   Other Visit Diagnoses       Healthcare maintenance    -  Primary   Relevant Orders   CMP14+EGFR   Lipid panel   CBC   TSH   Hemoglobin A1c     Panic attack       Relevant Medications   traZODone (DESYREL) 50 MG tablet     Throat discomfort           complete physical examination Keep up a regular exercise program and make sure you are eating a healthy diet Try to eat 4 servings of dairy a day, or if you are lactose intolerant take a calcium  with vitamin D  daily.  Your vaccines are up to date.  Colonoscopy schedule dfor next week.     Throat closing senstaion.  We discussed that it could actually be reflux causing the sensation I think it is worthwhile to do a trial of a PPI about an hour before bedtime for the next 8 weeks just to see if it goes away or if its less frequent and less intense when it happens if it continues then consider that it could be panic attacks just gets a little unusual without any recent triggers or increase stressors and it always seems to happen after she lays down.  No follow-ups on file.     Dorothyann Byars, MD

## 2023-08-22 ENCOUNTER — Encounter: Payer: Self-pay | Admitting: Family Medicine

## 2023-08-22 DIAGNOSIS — E7849 Other hyperlipidemia: Secondary | ICD-10-CM

## 2023-08-23 ENCOUNTER — Ambulatory Visit: Payer: Self-pay | Admitting: Family Medicine

## 2023-08-23 LAB — LIPID PANEL
Chol/HDL Ratio: 2.8 ratio (ref 0.0–4.4)
Cholesterol, Total: 187 mg/dL (ref 100–199)
HDL: 66 mg/dL (ref >39)
LDL Chol Calc (NIH): 94 mg/dL (ref 0–99)
Triglycerides: 155 mg/dL — ABNORMAL HIGH (ref 0–149)
VLDL Cholesterol Cal: 27 mg/dL (ref 5–40)

## 2023-08-23 LAB — HEMOGLOBIN A1C
Est. average glucose Bld gHb Est-mCnc: 114 mg/dL
Hgb A1c MFr Bld: 5.6 % (ref 4.8–5.6)

## 2023-08-23 LAB — CBC
Hematocrit: 41.1 % (ref 34.0–46.6)
Hemoglobin: 13.4 g/dL (ref 11.1–15.9)
MCH: 31.6 pg (ref 26.6–33.0)
MCHC: 32.6 g/dL (ref 31.5–35.7)
MCV: 97 fL (ref 79–97)
Platelets: 246 10*3/uL (ref 150–450)
RBC: 4.24 x10E6/uL (ref 3.77–5.28)
RDW: 12.9 % (ref 11.7–15.4)
WBC: 4.9 10*3/uL (ref 3.4–10.8)

## 2023-08-23 LAB — CMP14+EGFR
ALT: 15 IU/L (ref 0–32)
AST: 15 IU/L (ref 0–40)
Albumin: 4.2 g/dL (ref 3.9–4.9)
Alkaline Phosphatase: 78 IU/L (ref 44–121)
BUN/Creatinine Ratio: 23 (ref 12–28)
BUN: 16 mg/dL (ref 8–27)
Bilirubin Total: 0.5 mg/dL (ref 0.0–1.2)
CO2: 24 mmol/L (ref 20–29)
Calcium: 9.3 mg/dL (ref 8.7–10.3)
Chloride: 103 mmol/L (ref 96–106)
Creatinine, Ser: 0.71 mg/dL (ref 0.57–1.00)
Globulin, Total: 2.1 g/dL (ref 1.5–4.5)
Glucose: 92 mg/dL (ref 70–99)
Potassium: 4.4 mmol/L (ref 3.5–5.2)
Sodium: 142 mmol/L (ref 134–144)
Total Protein: 6.3 g/dL (ref 6.0–8.5)
eGFR: 93 mL/min/{1.73_m2} (ref >59)

## 2023-08-23 LAB — FERRITIN: Ferritin: 100 ng/mL (ref 15–150)

## 2023-08-23 LAB — TSH: TSH: 3.79 u[IU]/mL (ref 0.450–4.500)

## 2023-08-23 NOTE — Progress Notes (Signed)
 Hi Jordan Maynard, LDL looks good triglycerides up just slightly.  Metabolic panel and blood count are normal.  Thyroid looks great.  A1c in the normal range no sign of diabetes or prediabetes.  Iron stores look great.  Glad to get you scheduled for your telephone Medicare wellness call.

## 2023-08-27 LAB — HM COLONOSCOPY

## 2023-09-04 ENCOUNTER — Ambulatory Visit (INDEPENDENT_AMBULATORY_CARE_PROVIDER_SITE_OTHER): Payer: Self-pay

## 2023-09-04 DIAGNOSIS — E7849 Other hyperlipidemia: Secondary | ICD-10-CM

## 2023-09-09 ENCOUNTER — Ambulatory Visit: Payer: Self-pay | Admitting: Family Medicine

## 2023-09-09 NOTE — Progress Notes (Signed)
 Hi [place], the over read on the surrounding tissues are normal of your heart was normal.  And your calcium  score was 1.  A calcium  score between 1 and 99 means that it is reasonable to consider starting a cholesterol-lowering medication especially if over the age of 60.  This can reduce the potential for cardiovascular disease including heart attack and stroke over the next 10 years.  The good news is nothing more significant so for now would continue with your atorvastatin  regularly.  No additional workup recommended at this point in time.

## 2023-09-12 ENCOUNTER — Other Ambulatory Visit: Payer: Self-pay | Admitting: Family Medicine

## 2023-09-12 DIAGNOSIS — F5101 Primary insomnia: Secondary | ICD-10-CM

## 2023-09-18 ENCOUNTER — Other Ambulatory Visit: Payer: Self-pay | Admitting: Family Medicine

## 2023-09-18 DIAGNOSIS — G2581 Restless legs syndrome: Secondary | ICD-10-CM

## 2023-12-15 ENCOUNTER — Other Ambulatory Visit: Payer: Self-pay | Admitting: Family Medicine

## 2023-12-26 ENCOUNTER — Other Ambulatory Visit: Payer: Self-pay | Admitting: Medical Genetics

## 2023-12-26 DIAGNOSIS — Z006 Encounter for examination for normal comparison and control in clinical research program: Secondary | ICD-10-CM

## 2024-02-10 ENCOUNTER — Other Ambulatory Visit: Payer: Self-pay | Admitting: Family Medicine

## 2024-02-10 DIAGNOSIS — G2581 Restless legs syndrome: Secondary | ICD-10-CM

## 2024-02-12 ENCOUNTER — Other Ambulatory Visit: Payer: Self-pay | Admitting: Family Medicine

## 2024-02-28 LAB — GENECONNECT MOLECULAR SCREEN: Genetic Analysis Overall Interpretation: NEGATIVE
